# Patient Record
Sex: Female | Born: 1980 | Hispanic: Yes | Marital: Married | Smoking: Former smoker
Health system: Southern US, Community
[De-identification: ages and names within clinical notes are randomized; demographics above are authoritative.]

## PROBLEM LIST (undated history)

## (undated) ENCOUNTER — Inpatient Hospital Stay (HOSPITAL_COMMUNITY): Payer: Self-pay

## (undated) DIAGNOSIS — J3089 Other allergic rhinitis: Secondary | ICD-10-CM

## (undated) DIAGNOSIS — B019 Varicella without complication: Secondary | ICD-10-CM

## (undated) DIAGNOSIS — Z862 Personal history of diseases of the blood and blood-forming organs and certain disorders involving the immune mechanism: Secondary | ICD-10-CM

## (undated) DIAGNOSIS — G43909 Migraine, unspecified, not intractable, without status migrainosus: Secondary | ICD-10-CM

## (undated) DIAGNOSIS — J189 Pneumonia, unspecified organism: Secondary | ICD-10-CM

## (undated) DIAGNOSIS — B009 Herpesviral infection, unspecified: Secondary | ICD-10-CM

## (undated) DIAGNOSIS — Z8669 Personal history of other diseases of the nervous system and sense organs: Secondary | ICD-10-CM

## (undated) DIAGNOSIS — F5002 Anorexia nervosa, binge eating/purging type: Secondary | ICD-10-CM

## (undated) DIAGNOSIS — F50029 Anorexia nervosa, binge eating/purging type, unspecified: Secondary | ICD-10-CM

## (undated) DIAGNOSIS — T7840XA Allergy, unspecified, initial encounter: Secondary | ICD-10-CM

## (undated) DIAGNOSIS — A64 Unspecified sexually transmitted disease: Secondary | ICD-10-CM

## (undated) DIAGNOSIS — Z8616 Personal history of COVID-19: Secondary | ICD-10-CM

## (undated) DIAGNOSIS — O039 Complete or unspecified spontaneous abortion without complication: Secondary | ICD-10-CM

## (undated) DIAGNOSIS — R21 Rash and other nonspecific skin eruption: Secondary | ICD-10-CM

## (undated) DIAGNOSIS — D649 Anemia, unspecified: Secondary | ICD-10-CM

## (undated) HISTORY — PX: DILATION AND CURETTAGE OF UTERUS: SHX78

## (undated) HISTORY — DX: Rash and other nonspecific skin eruption: R21

## (undated) HISTORY — DX: Migraine, unspecified, not intractable, without status migrainosus: G43.909

## (undated) HISTORY — DX: Allergy, unspecified, initial encounter: T78.40XA

## (undated) HISTORY — DX: Anorexia nervosa, binge eating/purging type: F50.02

## (undated) HISTORY — DX: Pneumonia, unspecified organism: J18.9

## (undated) HISTORY — PX: TUMOR REMOVAL: SHX12

## (undated) HISTORY — DX: Varicella without complication: B01.9

## (undated) HISTORY — DX: Anemia, unspecified: D64.9

## (undated) HISTORY — DX: Unspecified sexually transmitted disease: A64

## (undated) HISTORY — DX: Anorexia nervosa, binge eating/purging type, unspecified: F50.029

## (undated) HISTORY — PX: NASAL SEPTUM SURGERY: SHX37

---

## 1998-01-21 ENCOUNTER — Other Ambulatory Visit: Admission: RE | Admit: 1998-01-21 | Discharge: 1998-01-21 | Payer: Self-pay | Admitting: Gynecology

## 1998-10-17 ENCOUNTER — Encounter: Payer: Self-pay | Admitting: Internal Medicine

## 1998-10-17 ENCOUNTER — Emergency Department (HOSPITAL_COMMUNITY): Admission: EM | Admit: 1998-10-17 | Discharge: 1998-10-17 | Payer: Self-pay | Admitting: Internal Medicine

## 1999-03-05 ENCOUNTER — Other Ambulatory Visit: Admission: RE | Admit: 1999-03-05 | Discharge: 1999-03-05 | Payer: Self-pay | Admitting: Gynecology

## 2000-01-12 ENCOUNTER — Other Ambulatory Visit: Admission: RE | Admit: 2000-01-12 | Discharge: 2000-01-12 | Payer: Self-pay | Admitting: Gynecology

## 2013-06-08 ENCOUNTER — Ambulatory Visit (INDEPENDENT_AMBULATORY_CARE_PROVIDER_SITE_OTHER): Payer: PRIVATE HEALTH INSURANCE | Admitting: Emergency Medicine

## 2013-06-08 VITALS — BP 110/70 | HR 53 | Temp 98.3°F | Resp 16 | Ht 65.0 in | Wt 135.0 lb

## 2013-06-08 DIAGNOSIS — L500 Allergic urticaria: Secondary | ICD-10-CM

## 2013-06-08 LAB — POCT CBC
Granulocyte percent: 64.1 %G (ref 37–80)
HCT, POC: 40.6 % (ref 37.7–47.9)
Hemoglobin: 12.7 g/dL (ref 12.2–16.2)
Lymph, poc: 2.1 (ref 0.6–3.4)
MCH, POC: 30.9 pg (ref 27–31.2)
MCHC: 31.3 g/dL — AB (ref 31.8–35.4)
MCV: 98.9 fL — AB (ref 80–97)
MID (cbc): 0.4 (ref 0–0.9)
MPV: 7.8 fL (ref 0–99.8)
POC Granulocyte: 4.6 (ref 2–6.9)
POC LYMPH PERCENT: 30.1 %L (ref 10–50)
POC MID %: 5.8 %M (ref 0–12)
Platelet Count, POC: 315 10*3/uL (ref 142–424)
RBC: 4.11 M/uL (ref 4.04–5.48)
RDW, POC: 12.9 %
WBC: 7.1 10*3/uL (ref 4.6–10.2)

## 2013-06-08 LAB — POCT SEDIMENTATION RATE: POCT SED RATE: 8 mm/hr (ref 0–22)

## 2013-06-08 MED ORDER — METHYLPREDNISOLONE ACETATE 80 MG/ML IJ SUSP
120.0000 mg | Freq: Once | INTRAMUSCULAR | Status: AC
  Administered 2013-06-08: 120 mg via INTRAMUSCULAR

## 2013-06-08 NOTE — Patient Instructions (Signed)
Ronchas   (Hives)   Las ronchas son áreas de la piel inflamadas (hinchadas) rojas y que pican. Pueden cambiar de tamaño y de ubicación en el cuerpo. Las ronchas pueden aparecer y desaparecer durante algunas horas o días (ronchas agudas) o durante algunas semanas (ronchas crónicas). No pueden transmitirse de una persona a otra (no son contagiosas). Pueden empeorar al rascarse, hacer ejercicios y por estrés emocional.   CAUSAS   · Reacción alérgica a alimentos, aditivos o fármacos.  · Infecciones, incluso el resfrío común.  · Enfermedades, como la vasculitis, el lupus o la enfermedad tiroidea.  · Exposición al sol, al calor o al frío.  · La práctica de ejercicios.  · El estrés.  · El contacto con algunas sustancias químicas.  SÍNTOMAS   · Zonas hinchadas, rojas o blancas, sobre la piel. Las ronchas pueden cambiar de tamaño, forma, ubicación y pueden desaparecer repentinamente.  · Picazón.  · Hinchazón de las manos los pies y el rostro. Esto puede ocurrir si las ronchas se desarrollan en capas profundas de la piel.  DIAGNÓSTICO   El médico puede diagnosticar el problema haciendo un examen físico. Le indicará análisis de sangre o un estudio de la piel para determinar la causa. En algunos casos, no puede determinarse la causa.   TRATAMIENTO   Los casos leves generalmente mejoran con medicamentos como los antihistamínicos. Los casos más graves pueden requerir una inyección de epinefrina de emergencia. Si se conoce la causa de la urticaria, el tratamiento incluye evitar el factor desencadenante.   INSTRUCCIONES PARA EL CUIDADO EN EL HOGAR   · Evite las causas que han desencadenado las ronchas.  · Tome los antihistamínicos según las indicaciones del médico para reducir la gravedad de las ronchas. Generalmente se recomiendan los antihistamínicos que no son sedantes o con bajo efecto sedante. No conduzca vehículos mientras toma antihistamínicos.  · Tome los medicamentos para la picazón exactamente como le indicó el  médico.  · Use ropas sueltas.  · Cumpla con todas las visitas de control, según le indique su médico.  SOLICITE ATENCIÓN MÉDICA SI:   · Siente una picazón intensa o persistente que no se calma con los medicamentos.  · Le duelen las articulaciones o están inflamadas.  SOLICITE ATENCIÓN MÉDICA DE INMEDIATO SI:   · Tiene fiebre.  · Tiene la boca o los labios hinchados.  · Tiene problemas para respirar o tragar.  · Siente una opresión en la garganta o en el pecho.  · Siente dolor abdominal.  Estos problemas pueden ser los primeros signos de una reacción alérgica que ponga en peligro la vida. Llame a los servicios de emergencia locales (911 en los [place]).  ASEGÚRESE DE QUE:   · Comprende estas instrucciones.  · Controlará su enfermedad.  · Solicitará ayuda de inmediato si no mejora o si empeora.  Document Released: 04/20/2005 Document Revised: 10/20/2011  ExitCare® Patient Information ©2014 [employer].

## 2013-06-08 NOTE — Progress Notes (Signed)
[facility] [address] [place] [address] [phone]  Date:  06/08/2013   Name:  Linda Herring   DOB:  March 11, 1981   MRN:  [id number]  PCP:  No primary provider on file.    Chief Complaint: Shortness of Breath and Rash   History of Present Illness:  Linda Herring is a 33 y.o. very pleasant female patient who presents with the following:  Last night after work, she noticed a red rash on her cheeks.  She took benadryl and slept all night.  She awoke this morning and had multiple erythematous patches that were pruritic.  She says they are largely gone.  She now has some generalized itching and muscle pains in her back that are pleuritic.  She has no new meds, chemicals or new allergen exposure.  No cough or wheezing, nausea or vomiting.  No insect exposure.  No improvement with over the counter medications or other home remedies. Denies other complaint or health concern today.   There are no active problems to display for this patient.   History reviewed. No pertinent past medical history.  History reviewed. No pertinent past surgical history.  History  Substance Use Topics  . Smoking status: Never Smoker   . Smokeless tobacco: Not on file  . Alcohol Use: No    Family History  Problem Relation Age of Onset  . Cancer Father   . Heart disease Maternal Grandmother   . Cancer Maternal Grandfather   . Cancer Paternal Grandfather   . Stroke Paternal Grandfather     No Known Allergies  Medication list has been reviewed and updated.  No current outpatient prescriptions on file prior to visit.   No current facility-administered medications on file prior to visit.    Review of Systems:  As per HPI, otherwise negative.    Physical Examination: Filed Vitals:   06/08/13 1734  BP: 110/70  Pulse: 53  Temp: 98.3 F (36.8 C)  Resp: 16   Filed Vitals:   06/08/13 1734  Height: 5\' 5"  (1.651 m)  Weight: 135 lb (61.236 kg)   Body mass index is  22.47 kg/(m^2). Ideal Body Weight: Weight in (lb) to have BMI = 25: 149.9  GEN: WDWN, NAD, Non-toxic, A & O x 3 HEENT: Atraumatic, Normocephalic. Neck supple. No masses, No LAD. Ears and Nose: No external deformity. CV: RRR, No M/G/R. No JVD. No thrill. No extra heart sounds. PULM: CTA B, no wheezes, crackles, rhonchi. No retractions. No resp. distress. No accessory muscle use. ABD: S, NT, ND, +BS. No rebound. No HSM. EXTR: No c/c/e NEURO Normal gait.  PSYCH: Normally interactive. Conversant. Not depressed or anxious appearing.  Calm demeanor.  Skin excoriated lesions, minimal hives  Assessment and Plan: Urticaria Benadryl qid Depo medrol  Signed,  Ellison Carwin, MD   Results for orders placed in visit on 06/08/13  POCT CBC      Result Value Range   WBC 7.1  4.6 - 10.2 K/uL   Lymph, poc 2.1  0.6 - 3.4   POC LYMPH PERCENT 30.1  10 - 50 %L   MID (cbc) 0.4  0 - 0.9   POC MID % 5.8  0 - 12 %M   POC Granulocyte 4.6  2 - 6.9   Granulocyte percent 64.1  37 - 80 %G   RBC 4.11  4.04 - 5.48 M/uL   Hemoglobin 12.7  12.2 - 16.2 g/dL   HCT, POC 40.6  37.7 - 47.9 %  MCV 98.9 (*) 80 - 97 fL   MCH, POC 30.9  27 - 31.2 pg   MCHC 31.3 (*) 31.8 - 35.4 g/dL   RDW, POC 12.9     Platelet Count, POC 315  142 - 424 K/uL   MPV 7.8  0 - 99.8 fL

## 2014-03-28 ENCOUNTER — Ambulatory Visit (INDEPENDENT_AMBULATORY_CARE_PROVIDER_SITE_OTHER): Payer: 59 | Admitting: Certified Nurse Midwife

## 2014-03-28 ENCOUNTER — Encounter: Payer: Self-pay | Admitting: Certified Nurse Midwife

## 2014-03-28 VITALS — BP 100/60 | HR 64 | Resp 16 | Ht 65.25 in | Wt 140.0 lb

## 2014-03-28 DIAGNOSIS — Z01419 Encounter for gynecological examination (general) (routine) without abnormal findings: Secondary | ICD-10-CM

## 2014-03-28 DIAGNOSIS — Z124 Encounter for screening for malignant neoplasm of cervix: Secondary | ICD-10-CM

## 2014-03-28 DIAGNOSIS — N390 Urinary tract infection, site not specified: Secondary | ICD-10-CM

## 2014-03-28 DIAGNOSIS — N912 Amenorrhea, unspecified: Secondary | ICD-10-CM

## 2014-03-28 DIAGNOSIS — Z0000 Encounter for general adult medical examination without abnormal findings: Secondary | ICD-10-CM

## 2014-03-28 LAB — LIPID PANEL
CHOL/HDL RATIO: 2.7 ratio
Cholesterol: 181 mg/dL (ref 0–200)
HDL: 68 mg/dL (ref >39)
LDL Cholesterol: 102 mg/dL — ABNORMAL HIGH (ref 0–99)
TRIGLYCERIDES: 54 mg/dL (ref <150)
VLDL: 11 mg/dL (ref 0–40)

## 2014-03-28 LAB — POCT URINALYSIS DIPSTICK
Bilirubin, UA: NEGATIVE
Glucose, UA: NEGATIVE
Ketones, UA: NEGATIVE
Leukocytes, UA: NEGATIVE
Nitrite, UA: POSITIVE
Protein, UA: NEGATIVE
Urobilinogen, UA: NEGATIVE
pH, UA: 5

## 2014-03-28 LAB — POCT URINE PREGNANCY: Preg Test, Ur: NEGATIVE

## 2014-03-28 LAB — CBC
HCT: 40.9 % (ref 36.0–46.0)
Hemoglobin: 13.7 g/dL (ref 12.0–15.0)
MCH: 31.1 pg (ref 26.0–34.0)
MCHC: 33.5 g/dL (ref 30.0–36.0)
MCV: 93 fL (ref 78.0–100.0)
MPV: 8.7 fL — ABNORMAL LOW (ref 9.4–12.4)
Platelets: 374 10*3/uL (ref 150–400)
RBC: 4.4 MIL/uL (ref 3.87–5.11)
RDW: 13.1 % (ref 11.5–15.5)
WBC: 6.2 10*3/uL (ref 4.0–10.5)

## 2014-03-28 LAB — TSH: TSH: 1.609 u[IU]/mL (ref 0.350–4.500)

## 2014-03-28 LAB — HEMOGLOBIN, FINGERSTICK: Hemoglobin, fingerstick: 13.5 g/dL (ref 12.0–16.0)

## 2014-03-28 MED ORDER — NITROFURANTOIN MONOHYD MACRO 100 MG PO CAPS: 100.0000 mg | ORAL_CAPSULE | Freq: Two times a day (BID) | ORAL | Status: DC

## 2014-03-28 NOTE — Patient Instructions (Signed)
Urinary Tract Infection Urinary tract infections (UTIs) can develop anywhere along your urinary tract. Your urinary tract is your body's drainage system for removing wastes and extra water. Your urinary tract includes two kidneys, two ureters, a bladder, and a urethra. Your kidneys are a pair of bean-shaped organs. Each kidney is about the size of your fist. They are located below your ribs, one on each side of your spine. CAUSES Infections are caused by microbes, which are microscopic organisms, including fungi, viruses, and bacteria. These organisms are so small that they can only be seen through a microscope. Bacteria are the microbes that most commonly cause UTIs. SYMPTOMS  Symptoms of UTIs may vary by age and gender of the patient and by the location of the infection. Symptoms in young women typically include a frequent and intense urge to urinate and a painful, burning feeling in the bladder or urethra during urination. Older women and men are more likely to be tired, shaky, and weak and have muscle aches and abdominal pain. A fever may mean the infection is in your kidneys. Other symptoms of a kidney infection include pain in your back or sides below the ribs, nausea, and vomiting. DIAGNOSIS To diagnose a UTI, your caregiver will ask you about your symptoms. Your caregiver also will ask to provide a urine sample. The urine sample will be tested for bacteria and white blood cells. White blood cells are made by your body to help fight infection. TREATMENT  Typically, UTIs can be treated with medication. Because most UTIs are caused by a bacterial infection, they usually can be treated with the use of antibiotics. The choice of antibiotic and length of treatment depend on your symptoms and the type of bacteria causing your infection. HOME CARE INSTRUCTIONS  If you were prescribed antibiotics, take them exactly as your caregiver instructs you. Finish the medication even if you feel better after you  have only taken some of the medication.  Drink enough water and fluids to keep your urine clear or pale yellow.  Avoid caffeine, tea, and carbonated beverages. They tend to irritate your bladder.  Empty your bladder often. Avoid holding urine for long periods of time.  Empty your bladder before and after sexual intercourse.  After a bowel movement, women should cleanse from front to back. Use each tissue only once. SEEK MEDICAL CARE IF:   You have back pain.  You develop a fever.  Your symptoms do not begin to resolve within 3 days. SEEK IMMEDIATE MEDICAL CARE IF:   You have severe back pain or lower abdominal pain.  You develop chills.  You have nausea or vomiting.  You have continued burning or discomfort with urination. MAKE SURE YOU:   Understand these instructions.  Will watch your condition.  Will get help right away if you are not doing well or get worse. Document Released: 01/28/2005 Document Revised: 10/20/2011 Document Reviewed: 05/29/2011 [facility] Patient Information 2015 [place], [place]. This information is not intended to replace advice given to you by your health care provider. Make sure you discuss any questions you have with your health care provider. EXERCISE AND DIET:  We recommended that you start or continue a regular exercise program for good health. Regular exercise means any activity that makes your heart beat faster and makes you sweat.  We recommend exercising at least 30 minutes per day at least 3 days a week, preferably 4 or 5.  We also recommend a diet low in fat and sugar.  Inactivity, poor  dietary choices and obesity can cause diabetes, heart attack, stroke, and kidney damage, among others.    ALCOHOL AND SMOKING:  Women should limit their alcohol intake to no more than 7 drinks/beers/glasses of wine (combined, not each!) per week. Moderation of alcohol intake to this level decreases your risk of breast cancer and liver damage. And of course, no  recreational drugs are part of a healthy lifestyle.  And absolutely no smoking or even second hand smoke. Most people know smoking can cause heart and lung diseases, but did you know it also contributes to weakening of your bones? Aging of your skin?  Yellowing of your teeth and nails?  CALCIUM AND VITAMIN D:  Adequate intake of calcium and Vitamin D are recommended.  The recommendations for exact amounts of these supplements seem to change often, but generally speaking 600 mg of calcium (either carbonate or citrate) and 800 units of Vitamin D per day seems prudent. Certain women may benefit from higher intake of Vitamin D.  If you are among these women, your doctor will have told you during your visit.    PAP SMEARS:  Pap smears, to check for cervical cancer or precancers,  have traditionally been done yearly, although recent scientific advances have shown that most women can have pap smears less often.  However, every woman still should have a physical exam from her gynecologist every year. It will include a breast check, inspection of the vulva and vagina to check for abnormal growths or skin changes, a visual exam of the cervix, and then an exam to evaluate the size and shape of the uterus and ovaries.  And after 35 years of age, a rectal exam is indicated to check for rectal cancers. We will also provide age appropriate advice regarding health maintenance, like when you should have certain vaccines, screening for sexually transmitted diseases, bone density testing, colonoscopy, mammograms, etc.   MAMMOGRAMS:  All women over 58 years old should have a yearly mammogram. Many facilities now offer a "3D" mammogram, which may cost around $50 extra out of pocket. If possible,  we recommend you accept the option to have the 3D mammogram performed.  It both reduces the number of women who will be called back for extra views which then turn out to be normal, and it is better than the routine mammogram at detecting  truly abnormal areas.    COLONOSCOPY:  Colonoscopy to screen for colon cancer is recommended for all women at age 59.  We know, you hate the idea of the prep.  We agree, BUT, having colon cancer and not knowing it is worse!!  Colon cancer so often starts as a polyp that can be seen and removed at colonscopy, which can quite literally save your life!  And if your first colonoscopy is normal and you have no family history of colon cancer, most women don't have to have it again for 10 years.  Once every ten years, you can do something that may end up saving your life, right?  We will be happy to help you get it scheduled when you are ready.  Be sure to check your insurance coverage so you understand how much it will cost.  It may be covered as a preventative service at no cost, but you should check your particular policy.

## 2014-03-28 NOTE — Progress Notes (Signed)
33 y.o. G3P0030 Divorced [employer] here to establish gyn care and for annual exam. Periods normal, minimal cramping. Patient was seen by endocrinologist for infertility and labs and was told she had immune system response that would "attack the pregnancy". She would like to investigate this. She has had 3 SAB with D&C after each. Unknown blood type and Rh. Not trying for pregnancy at this time. Complaining of urinary frequency and urine odor for the past few weeks. Denies back pain or pain with urination, fever or chills. Sees PCP prn. No other health concerns today.   Patient's last menstrual period was 02/24/2014.          Sexually active: Yes.    The current method of family planning is condoms all the time.    Exercising: Yes.    walking Smoker:  no  Health Maintenance: Pap:  02/2013 neg per patient MMG:  none Colonoscopy:  none BMD:   none TDaP:  Within 10 yrs Labs: Poct urine-nitrite pos, rbc-tr, Hgb-13.5, Upt-neg Self breast exam:done occ   reports that she has quit smoking. She does not have any smokeless tobacco history on file. She reports that she drinks about 1.2 - 1.8 oz of alcohol per week. She reports that she does not use illicit drugs.  Past Medical History  Diagnosis Date  . Migraines   . Anemia     Past Surgical History  Procedure Laterality Date  . Dilation and curettage of uterus    . Nasal septum surgery    . 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 ibuprofen (ADVIL,MOTRIN) 200 MG tablet Take 200 mg by mouth every 6 (six) hours as needed.     No current facility-administered medications for this visit.    Family History  Problem Relation Age of Onset  . Cancer Father   . Lung cancer Father   . Diabetes Father   . Heart disease Maternal Grandmother   . Heart attack Maternal Grandmother   . Other Paternal Grandfather     brain tumor  . Colon cancer Paternal Grandmother    . Diabetes Paternal Uncle     ROS:  Pertinent items are noted in HPI.  Otherwise, a comprehensive ROS was negative.  Exam:   BP 100/60 mmHg  Pulse 64  Resp 16  Ht 5' 5.25" (1.657 m)  Wt 140 lb (63.504 kg)  BMI 23.13 kg/m2  LMP 02/24/2014 Height: 5' 5.25" (165.7 cm)  Ht Readings from Last 3 Encounters:  03/28/14 5' 5.25" (1.657 m)  06/08/13 5\' 5"  (1.651 m)    General appearance: alert, cooperative and appears stated age Head: Normocephalic, without obvious abnormality, atraumatic Neck: no adenopathy, supple, symmetrical, trachea midline and thyroid normal to inspection and palpation Lungs: clear to auscultation bilaterally Breasts: normal appearance, no masses or tenderness, No nipple retraction or dimpling, No nipple discharge or bleeding, No axillary or supraclavicular adenopathy Heart: regular rate and rhythm Abdomen: soft, non-tender; no masses,  no organomegaly Extremities: extremities normal, atraumatic, no cyanosis or edema Skin: Skin color, texture, turgor normal. No rashes or lesions Lymph nodes: Cervical, supraclavicular, and axillary nodes normal. No abnormal inguinal nodes palpated Neurologic: Grossly normal   Pelvic: External genitalia:  no lesions              Urethra:  normal appearing urethra with no masses, tenderness or lesions  Bartholin's and Skene's: normal                 Vagina: normal appearing vagina with normal color and discharge, no lesions              Cervix: normal, no lesions, non tender, nabothian cysts seen at os              Pap taken: Yes.   Bimanual Exam:  Uterus:  normal size, contour, position, consistency, mobility, non-tender and mid position tilts to left              Adnexa: normal adnexa and no mass, fullness, tenderness               Rectovaginal: Confirms               Anus:  normal sphincter tone, no lesions  A:  Well Woman with normal exam  Contraception condoms consistent  UTI  OB history of 3 SAB before 12  weeks  Strong family history of heart disease with heart attack early age  P:   Reviewed health and wellness pertinent to exam  Discussed UTI findings and need for treatment patient agreeable. Discussed warning signs of UTI and need to advise. Avoid excessive coffee or tea.  Rx Macrobid see order  Lab: Urine micro/culture  Discussed screening labs and ABO RH to see if this is the concern with pregnancy. Patient agreeable  Labs: Cholesterol, TSH,CBC,   Pap smear taken today with HPVHR   counseled on breast self exam, STD prevention, HIV risk factors and prevention, adequate intake of calcium and vitamin D, diet and exercise. Discussed healthy lifestyle to decrease risk of heart disease.  return annually or prn  An After Visit Summary was printed and given to the patient.

## 2014-03-29 LAB — ABO AND RH: Rh Type: POSITIVE

## 2014-03-29 LAB — URINALYSIS, MICROSCOPIC ONLY
CASTS: NONE SEEN
CRYSTALS: NONE SEEN

## 2014-03-31 LAB — URINE CULTURE

## 2014-03-31 NOTE — Progress Notes (Signed)
Needs referral to Dr. Kerin Perna even if labs are normal.  Reviewed personally.  Felipa Emory, MD.

## 2014-04-02 LAB — IPS PAP TEST WITH HPV

## 2014-04-05 ENCOUNTER — Other Ambulatory Visit: Payer: Self-pay | Admitting: Certified Nurse Midwife

## 2014-04-05 ENCOUNTER — Telehealth: Payer: Self-pay

## 2014-04-05 DIAGNOSIS — N96 Recurrent pregnancy loss: Secondary | ICD-10-CM

## 2014-04-05 DIAGNOSIS — R899 Unspecified abnormal finding in specimens from other organs, systems and tissues: Secondary | ICD-10-CM

## 2014-04-05 NOTE — Telephone Encounter (Signed)
-----   Message from Regina Eck, CNM sent at 04/05/2014  8:46 AM EST ----- Notify blood type B positive TSH normal CBC essentially normal except MPV slightly low but platelets normal range, recheck at [facility] of urine Order in Lipid Panel is optimal level No indications from labs of problems, concerning previous pregnancy history. Patient needs referral to Infertility, Dr Henrietta Dine

## 2014-04-05 NOTE — Telephone Encounter (Signed)
Spoke with patient. Advised patient of message as seen below from Regina Eck CNM. Patient is agreeable and verbalizes understanding. TOC appointment scheduled for 12/16 at 2:30pm. Patient agreeable to date and time. Referral placed to [facility]. Patient aware will be contacted by referral's coordinator in our office or [place] office regarding appointment.   Cc: Felipa Emory for referral  Routing to provider for final review. Patient agreeable to disposition. Will close encounter

## 2014-04-09 ENCOUNTER — Telehealth: Payer: Self-pay | Admitting: Certified Nurse Midwife

## 2014-04-09 NOTE — Telephone Encounter (Signed)
Reviewed and noted.

## 2014-04-09 NOTE — Telephone Encounter (Signed)
Dr.Yalcinkya's office calling with patients appointment date 05/18/2013 @2 :00.

## 2014-04-18 ENCOUNTER — Ambulatory Visit (INDEPENDENT_AMBULATORY_CARE_PROVIDER_SITE_OTHER): Payer: 59

## 2014-04-18 VITALS — BP 102/70 | HR 68 | Ht 65.25 in | Wt 142.0 lb

## 2014-04-18 DIAGNOSIS — R899 Unspecified abnormal finding in specimens from other organs, systems and tissues: Secondary | ICD-10-CM

## 2014-04-18 DIAGNOSIS — N390 Urinary tract infection, site not specified: Secondary | ICD-10-CM

## 2014-04-18 NOTE — Progress Notes (Signed)
Pt came in for a nurse visit to give a urine TOC Pt states no sx and has finished antibiotics.  Culture and Micro sent to lab   CBC was drawn while in office

## 2014-04-19 LAB — CBC
HEMATOCRIT: 40.4 % (ref 36.0–46.0)
HEMOGLOBIN: 13.1 g/dL (ref 12.0–15.0)
MCH: 30 pg (ref 26.0–34.0)
MCHC: 32.4 g/dL (ref 30.0–36.0)
MCV: 92.7 fL (ref 78.0–100.0)
MPV: 8.9 fL — ABNORMAL LOW (ref 9.4–12.4)
Platelets: 344 10*3/uL (ref 150–400)
RBC: 4.36 MIL/uL (ref 3.87–5.11)
RDW: 13 % (ref 11.5–15.5)
WBC: 7.2 10*3/uL (ref 4.0–10.5)

## 2014-04-19 LAB — URINALYSIS, MICROSCOPIC ONLY
CASTS: NONE SEEN
CRYSTALS: NONE SEEN

## 2014-04-20 LAB — URINE CULTURE
Colony Count: NO GROWTH
Organism ID, Bacteria: NO GROWTH

## 2014-09-07 ENCOUNTER — Ambulatory Visit (INDEPENDENT_AMBULATORY_CARE_PROVIDER_SITE_OTHER): Payer: BLUE CROSS/BLUE SHIELD | Admitting: Family

## 2014-09-07 ENCOUNTER — Other Ambulatory Visit (INDEPENDENT_AMBULATORY_CARE_PROVIDER_SITE_OTHER): Payer: BLUE CROSS/BLUE SHIELD

## 2014-09-07 ENCOUNTER — Encounter: Payer: Self-pay | Admitting: Family

## 2014-09-07 VITALS — BP 120/80 | HR 59 | Temp 98.7°F | Resp 18 | Ht 65.0 in | Wt 144.8 lb

## 2014-09-07 DIAGNOSIS — Z889 Allergy status to unspecified drugs, medicaments and biological substances status: Secondary | ICD-10-CM | POA: Insufficient documentation

## 2014-09-07 DIAGNOSIS — R233 Spontaneous ecchymoses: Secondary | ICD-10-CM

## 2014-09-07 DIAGNOSIS — Z9109 Other allergy status, other than to drugs and biological substances: Secondary | ICD-10-CM

## 2014-09-07 DIAGNOSIS — R238 Other skin changes: Secondary | ICD-10-CM

## 2014-09-07 DIAGNOSIS — G43109 Migraine with aura, not intractable, without status migrainosus: Secondary | ICD-10-CM | POA: Insufficient documentation

## 2014-09-07 DIAGNOSIS — G43809 Other migraine, not intractable, without status migrainosus: Secondary | ICD-10-CM

## 2014-09-07 DIAGNOSIS — G43009 Migraine without aura, not intractable, without status migrainosus: Secondary | ICD-10-CM | POA: Insufficient documentation

## 2014-09-07 LAB — CBC WITH DIFFERENTIAL/PLATELET
BASOS PCT: 0.3 % (ref 0.0–3.0)
Basophils Absolute: 0 10*3/uL (ref 0.0–0.1)
EOS PCT: 0.9 % (ref 0.0–5.0)
Eosinophils Absolute: 0.1 10*3/uL (ref 0.0–0.7)
HCT: 39.3 % (ref 36.0–46.0)
Hemoglobin: 13.4 g/dL (ref 12.0–15.0)
LYMPHS ABS: 1.5 10*3/uL (ref 0.7–4.0)
Lymphocytes Relative: 24.6 % (ref 12.0–46.0)
MCHC: 34.2 g/dL (ref 30.0–36.0)
MCV: 91.4 fl (ref 78.0–100.0)
Monocytes Absolute: 0.5 10*3/uL (ref 0.1–1.0)
Monocytes Relative: 7.6 % (ref 3.0–12.0)
NEUTROS PCT: 66.6 % (ref 43.0–77.0)
Neutro Abs: 4 10*3/uL (ref 1.4–7.7)
PLATELETS: 387 10*3/uL (ref 150.0–400.0)
RBC: 4.3 Mil/uL (ref 3.87–5.11)
RDW: 12.9 % (ref 11.5–15.5)
WBC: 5.9 10*3/uL (ref 4.0–10.5)

## 2014-09-07 LAB — IBC PANEL
Iron: 77 ug/dL (ref 42–145)
Saturation Ratios: 20.1 % (ref 20.0–50.0)
TRANSFERRIN: 274 mg/dL (ref 212.0–360.0)

## 2014-09-07 LAB — PROTIME-INR
INR: 1 ratio (ref 0.8–1.0)
PROTHROMBIN TIME: 10.6 s (ref 9.6–13.1)

## 2014-09-07 NOTE — Assessment & Plan Note (Signed)
Indicates she bruises easily. Obtain iron, CBC with differential and INR/PTT.

## 2014-09-07 NOTE — Patient Instructions (Signed)
Thank you for choosing [facility].  Summary/Instructions:  Please stop by the lab on the basement level of the building for your blood work. Your results will be released to [place] (or called to you) after review, usually within 72 hours after test completion. If any changes need to be made, you will be notified at that same time.  Please stop by radiology on the basement level of the building for your x-rays. Your results will be released to  (or called to you) after review, usually within 72 hours after test completion. If any treatments or changes are necessary, you will be notified at that same time.  Referrals have been made during this visit. You should expect to hear back from our schedulers in about 10-14 days in regards to establishing an appointment with the specialists we discussed.   If your symptoms worsen or fail to improve, please contact our office for further instruction, or in case of emergency go directly to the emergency room at the closest medical facility.   Allergy Skin Testing Skin testing is one of the easiest and least expensive tests for detecting allergies. Skin testing is done by injecting an allergen (something that causes an allergic response) into the skin. The size of the wheal (tiny area of swelling) and flare (area of inflammation or redness) surrounding the area of the test, determine if it is positive. There are two methods of doing a skin test. One method is called the prick puncture test where the allergen is injected into the top layer of the skin (epidermis). Another method is injecting the allergen into the slightly deeper layer (dermis). More severe allergic reactions have been reported with this deeper injection. If skin testing has been performed and a questionable result is obtained, it can be confirmed by RAST. Two drawbacks to the RAST test are expense and the results are not being immediately available. PREPARATION FOR TEST No  preparation is necessary but the test should not be done if there have been severe allergic reactions such as anaphylaxis. NORMAL FINDINGS   Less Than 67mm wheal diameter  Less Than 10 mm flare diameter Ranges for normal findings may vary among different laboratories and hospitals. You should always check with your doctor after having lab work or other tests done to discuss the meaning of your test results and whether your values are considered within normal limits. MEANING OF TEST  Your caregiver will go over the test results with you and discuss the importance and meaning of your results, as well as treatment options and the need for additional tests if necessary. OBTAINING THE TEST RESULTS  It is your responsibility to obtain your test results. Ask the lab or department performing the test when and how you will get your results. Document Released: 05/13/2004 Document Revised: 07/13/2011 Document Reviewed: 03/25/2008 [facility] Patient Information 2015 [place], [place]. This information is not intended to replace advice given to you by your health care provider. Make sure you discuss any questions you have with your health care provider.   Migraine Headache A migraine headache is an intense, throbbing pain on one or both sides of your head. A migraine can last for 30 minutes to several hours. CAUSES  The exact cause of a migraine headache is not always known. However, a migraine may be caused when nerves in the brain become irritated and release chemicals that cause inflammation. This causes pain. Certain things may also trigger migraines, such as:  Alcohol.  Smoking.  Stress.  Menstruation.  Aged cheeses.  Foods or drinks that contain nitrates, glutamate, aspartame, or tyramine.  Lack of sleep.  Chocolate.  Caffeine.  Hunger.  Physical exertion.  Fatigue.  Medicines used to treat chest pain (nitroglycerine), birth control pills, estrogen, and some blood pressure  medicines. SIGNS AND SYMPTOMS  Pain on one or both sides of your head.  Pulsating or throbbing pain.  Severe pain that prevents daily activities.  Pain that is aggravated by any physical activity.  Nausea, vomiting, or both.  Dizziness.  Pain with exposure to bright lights, loud noises, or activity.  General sensitivity to bright lights, loud noises, or smells. Before you get a migraine, you may get warning signs that a migraine is coming (aura). An aura may include:  Seeing flashing lights.  Seeing bright spots, halos, or zigzag lines.  Having tunnel vision or blurred vision.  Having feelings of numbness or tingling.  Having trouble talking.  Having muscle weakness. DIAGNOSIS  A migraine headache is often diagnosed based on:  Symptoms.  Physical exam.  A CT scan or MRI of your head. These imaging tests cannot diagnose migraines, but they can help rule out other causes of headaches. TREATMENT Medicines may be given for pain and nausea. Medicines can also be given to help prevent recurrent migraines.  HOME CARE INSTRUCTIONS  Only take over-the-counter or prescription medicines for pain or discomfort as directed by your health care provider. The use of long-term narcotics is not recommended.  Lie down in a dark, quiet room when you have a migraine.  Keep a journal to find out what may trigger your migraine headaches. For example, write down:  What you eat and drink.  How much sleep you get.  Any change to your diet or medicines.  Limit alcohol consumption.  Quit smoking if you smoke.  Get 7-9 hours of sleep, or as recommended by your health care provider.  Limit stress.  Keep lights dim if bright lights bother you and make your migraines worse. SEEK IMMEDIATE MEDICAL CARE IF:   Your migraine becomes severe.  You have a fever.  You have a stiff neck.  You have vision loss.  You have muscular weakness or loss of muscle control.  You start losing  your balance or have trouble walking.  You feel faint or pass out.  You have severe symptoms that are different from your first symptoms. MAKE SURE YOU:   Understand these instructions.  Will watch your condition.  Will get help right away if you are not doing well or get worse. Document Released: 04/20/2005 Document Revised: 09/04/2013 Document Reviewed: 12/26/2012 [facility] Patient Information 2015 [place], [place]. This information is not intended to replace advice given to you by your health care provider. Make sure you discuss any questions you have with your health care provider.

## 2014-09-07 NOTE — Assessment & Plan Note (Signed)
Describes symptoms of atypical migraine. Currently controlled with over-the-counter medications. Requesting referral to neurologist. Referral placed. Continue management with over-the-counter medication as needed. Follow-up if prescription medication and wishes to be tried.

## 2014-09-07 NOTE — Progress Notes (Signed)
Subjective:    Patient ID: Linda Herring, female    DOB: [birth year], 34 y.o.   MRN: [id number]  Chief Complaint  Patient presents with  . Establish Care    x2 months, has started to have bad allergies, says that everything breaks her out, certain foods and even water she says when she takes a bath she breaks out in a rash as soon as she is in water and starts to itch really bad, she also has really bad headaches and wanted to see if she could see a neurologist    HPI:  Linda Herring is a 34 y.o. female who presents today for an office visit to establish care.   1) Allergies - Associated symptoms of allergies has been going on for about 2 months. Notes that there are foods that make her break out including corn dogs, chicken, beef or anything at a restaurant. Reaction is primarily uticaria. May have had some angioedema at some point but is unsure. Notes that this typically does not happen with the same foods she can eat.  Also notes when she took a bath and noted that the water caused her to break out, she has adjusted products and now not currently experiencing symptoms x 4 days. Notes it has happened with crab. Has never been tested for any specific allergies. She currently takes Zyrtec and Benedryl. Has to take the benedryl 4-5 days per week.   2) Headaches -  Previously diagnosed with migraine headaches. Associated symptom of a headaches. Notes that the frequency of specific headaches has increased and is described as needles. Also has headaches that have are described as throbbing with sensitivity to light and sound. Has previous been on medications for headaches which helped. There was one headache that was sensitive to touch and and noted some left sided weakness for a brief period of time which has resolved.    No Known Allergies   Current Outpatient Prescriptions on File Prior to Visit  Medication Sig Dispense Refill  . ibuprofen (ADVIL,MOTRIN) 200 MG tablet Take 200 mg by mouth  every 6 (six) hours as needed.     No current facility-administered medications on file prior to visit.    Past Medical History  Diagnosis Date  . Migraines   . Anemia   . Chicken pox   . Allergy   . Anorexia nervosa with bulimia     Past Surgical History  Procedure Laterality Date  . Dilation and curettage of uterus    . Nasal septum surgery    . 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 Tonsillectomy      Family History  Problem Relation Age of Onset  . Cancer Father   . Lung cancer Father   . Diabetes Father   . Heart disease Maternal Grandmother   . Heart attack Maternal Grandmother   . Other Paternal Grandfather     brain tumor  . Colon cancer Paternal Grandmother     History   Social History  . Marital Status: Divorced    Spouse Name: N/A  . Number of Children: 0  . Years of Education: 16   Occupational History  . Customer Specialist at [employer]    Social History Main Topics  . Smoking status: Former Research scientist (life sciences)  . Smokeless tobacco: Never Used  . Alcohol Use: 1.2 - 1.8 oz/week    2-3 Glasses of wine per week  . Drug Use: No  . Sexual Activity:    Partners:  Male    Birth Control/ Protection: Condom   Other Topics Concern  . Not on file   Social History Narrative   Fun: Read, hike and travel.   Denies religious beliefs effecting health care.     Review of Systems  Constitutional: Negative for fever and chills.  Neurological: Positive for numbness and headaches.      Objective:    BP 120/80 mmHg  Pulse 59  Temp(Src) 98.7 F (37.1 C) (Oral)  Resp 18  Ht 5\' 5"  (1.651 m)  Wt 144 lb 12.8 oz (65.681 kg)  BMI 24.10 kg/m2  SpO2 97% Nursing note and vital signs reviewed.  Physical Exam  Constitutional: She is oriented to person, place, and time. She appears well-developed and well-nourished. No distress.  HENT:  Right Ear: Hearing, tympanic membrane, external ear and ear canal normal.  Left Ear: Hearing, tympanic membrane, external ear and ear canal  normal.  Nose: Nose normal. Right sinus exhibits no maxillary sinus tenderness and no frontal sinus tenderness. Left sinus exhibits no maxillary sinus tenderness and no frontal sinus tenderness.  Mouth/Throat: Uvula is midline, oropharynx is clear and moist and mucous membranes are normal.  Cardiovascular: Normal rate, regular rhythm, normal heart sounds and intact distal pulses.   Pulmonary/Chest: Effort normal and breath sounds normal.  Neurological: She is alert and oriented to person, place, and time.  Skin: Skin is warm and dry.  Psychiatric: She has a normal mood and affect. Her behavior is normal. Judgment and thought content normal.       Assessment & Plan:

## 2014-09-07 NOTE — Assessment & Plan Note (Signed)
Describes multiple allergies to food and environment with no episodes of anaphylaxis. Urticaria appears well controlled with over-the-counter Benadryl. No current outbreaks the last 4 days. Continue to track potential sources of allergies. Refer to allergist for testing. Follow-up if symptoms worsen or fail to improve.

## 2014-09-07 NOTE — Progress Notes (Signed)
Pre visit review using our clinic review tool, if applicable. No additional management support is needed unless otherwise documented below in the visit note. 

## 2014-09-10 ENCOUNTER — Emergency Department (HOSPITAL_COMMUNITY)
Admission: EM | Admit: 2014-09-10 | Discharge: 2014-09-10 | Disposition: A | Discharge status: Home / Self Care | Payer: BLUE CROSS/BLUE SHIELD | Attending: Emergency Medicine | Admitting: Emergency Medicine

## 2014-09-10 ENCOUNTER — Encounter (HOSPITAL_COMMUNITY): Payer: Self-pay | Admitting: Emergency Medicine

## 2014-09-10 DIAGNOSIS — Z79899 Other long term (current) drug therapy: Secondary | ICD-10-CM | POA: Diagnosis not present

## 2014-09-10 DIAGNOSIS — Z8659 Personal history of other mental and behavioral disorders: Secondary | ICD-10-CM | POA: Diagnosis not present

## 2014-09-10 DIAGNOSIS — Z87891 Personal history of nicotine dependence: Secondary | ICD-10-CM | POA: Diagnosis not present

## 2014-09-10 DIAGNOSIS — M5442 Lumbago with sciatica, left side: Secondary | ICD-10-CM | POA: Diagnosis not present

## 2014-09-10 DIAGNOSIS — M545 Low back pain: Secondary | ICD-10-CM | POA: Diagnosis present

## 2014-09-10 DIAGNOSIS — Z8679 Personal history of other diseases of the circulatory system: Secondary | ICD-10-CM | POA: Insufficient documentation

## 2014-09-10 DIAGNOSIS — Z8619 Personal history of other infectious and parasitic diseases: Secondary | ICD-10-CM | POA: Diagnosis not present

## 2014-09-10 DIAGNOSIS — Z862 Personal history of diseases of the blood and blood-forming organs and certain disorders involving the immune mechanism: Secondary | ICD-10-CM | POA: Diagnosis not present

## 2014-09-10 MED ORDER — METHOCARBAMOL 500 MG PO TABS
500.0000 mg | ORAL_TABLET | Freq: Once | ORAL | Status: AC
  Administered 2014-09-10: 500 mg via ORAL
  Filled 2014-09-10: qty 1

## 2014-09-10 MED ORDER — NAPROXEN 500 MG PO TABS: 500.0000 mg | ORAL_TABLET | Freq: Two times a day (BID) | ORAL | Status: DC

## 2014-09-10 MED ORDER — HYDROCODONE-ACETAMINOPHEN 5-325 MG PO TABS: 1.0000 | ORAL_TABLET | Freq: Four times a day (QID) | ORAL | Status: DC | PRN

## 2014-09-10 MED ORDER — METHOCARBAMOL 500 MG PO TABS: 500.0000 mg | ORAL_TABLET | Freq: Two times a day (BID) | ORAL | Status: DC

## 2014-09-10 MED ORDER — OXYCODONE-ACETAMINOPHEN 5-325 MG PO TABS
2.0000 | ORAL_TABLET | Freq: Once | ORAL | Status: AC
  Administered 2014-09-10: 2 via ORAL
  Filled 2014-09-10: qty 2

## 2014-09-10 NOTE — ED Notes (Signed)
Pt c/o low back pain, denies loss of control of bowel or bladder.
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
## 2014-09-10 NOTE — Discharge Instructions (Signed)
Followup with orthopedics if symptoms continue. Use conservative methods at home including heat therapy and cold therapy as we discussed. More information on cold therapy is listed below.  It is not reccommended to use heat treatment directly after an acute injury.  SEEK IMMEDIATE MEDICAL ATTENTION IF: New numbness, tingling, weakness, or problem with the use of your arms or legs.  Severe back pain not relieved with medications.  Change in bowel or bladder control.  Increasing pain in any areas of the body (such as chest or abdominal pain).  Shortness of breath, dizziness or fainting.  Nausea (feeling sick to your stomach), vomiting, fever, or sweats.  COLD THERAPY DIRECTIONS:  Ice or gel packs can be used to reduce both pain and swelling. Ice is the most helpful within the first 24 to 48 hours after an injury or flareup from overusing a muscle or joint.  Ice is effective, has very few side effects, and is safe for most people to use.   If you expose your skin to cold temperatures for too long or without the proper protection, you can damage your skin or nerves. Watch for signs of skin damage due to cold.   HOME CARE INSTRUCTIONS  Follow these tips to use ice and cold packs safely.  Place a dry or damp towel between the ice and skin. A damp towel will cool the skin more quickly, so you may need to shorten the time that the ice is used.  For a more rapid response, add gentle compression to the ice.  Ice for no more than 10 to 20 minutes at a time. The bonier the area you are icing, the less time it will take to get the benefits of ice.  Check your skin after 5 minutes to make sure there are no signs of a poor response to cold or skin damage.  Rest 20 minutes or more in between uses.  Once your skin is numb, you can end your treatment. You can test numbness by very lightly touching your skin. The touch should be so light that you do not see the skin dimple from the pressure of your fingertip.  When using ice, most people will feel these normal sensations in this order: cold, burning, aching, and numbness.  Do not use ice on someone who cannot communicate their responses to pain, such as small children or people with dementia.   HOW TO MAKE AN ICE PACK  To make an ice pack, do one of the following:  Place crushed ice or a bag of frozen vegetables in a sealable plastic bag. Squeeze out the excess air. Place this bag inside another plastic bag. Slide the bag into a pillowcase or place a damp towel between your skin and the bag.  Mix 3 parts water with 1 part rubbing alcohol. Freeze the mixture in a sealable plastic bag. When you remove the mixture from the freezer, it will be slushy. Squeeze out the excess air. Place this bag inside another plastic bag. Slide the bag into a pillowcase or place a damp towel between your skin and the bag.   SEEK MEDICAL CARE IF:  You develop white spots on your skin. This may give the skin a blotchy (mottled) appearance.  Your skin turns blue or pale.  Your skin becomes waxy or hard.  Your swelling gets worse.  MAKE SURE YOU:  Understand these instructions.  Will watch your condition.  Will get help right away if you are not doing well or   get worse.

## 2014-10-24 ENCOUNTER — Encounter: Payer: Self-pay | Admitting: Neurology

## 2014-10-24 ENCOUNTER — Ambulatory Visit (INDEPENDENT_AMBULATORY_CARE_PROVIDER_SITE_OTHER): Payer: BLUE CROSS/BLUE SHIELD | Admitting: Neurology

## 2014-10-24 VITALS — BP 118/78 | HR 66 | Resp 20 | Ht 65.0 in | Wt 144.4 lb

## 2014-10-24 DIAGNOSIS — G43009 Migraine without aura, not intractable, without status migrainosus: Secondary | ICD-10-CM

## 2014-10-24 DIAGNOSIS — M5481 Occipital neuralgia: Secondary | ICD-10-CM | POA: Diagnosis not present

## 2014-10-24 NOTE — Progress Notes (Signed)
See note
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
## 2014-10-24 NOTE — Patient Instructions (Signed)
I think you have occipital neuralgia.  However, if it is so infrequent, I wouldn't do anything for it.  If it gets worse, call us and we can re-evaluate.

## 2014-12-06 ENCOUNTER — Institutional Professional Consult (permissible substitution): Payer: BLUE CROSS/BLUE SHIELD | Admitting: Internal Medicine

## 2015-04-16 ENCOUNTER — Ambulatory Visit (INDEPENDENT_AMBULATORY_CARE_PROVIDER_SITE_OTHER): Payer: BLUE CROSS/BLUE SHIELD | Admitting: Certified Nurse Midwife

## 2015-04-16 ENCOUNTER — Encounter: Payer: Self-pay | Admitting: Certified Nurse Midwife

## 2015-04-16 VITALS — BP 110/70 | HR 68 | Resp 16 | Ht 65.5 in | Wt 149.0 lb

## 2015-04-16 DIAGNOSIS — N9489 Other specified conditions associated with female genital organs and menstrual cycle: Secondary | ICD-10-CM

## 2015-04-16 DIAGNOSIS — Z01419 Encounter for gynecological examination (general) (routine) without abnormal findings: Secondary | ICD-10-CM

## 2015-04-16 DIAGNOSIS — N898 Other specified noninflammatory disorders of vagina: Secondary | ICD-10-CM

## 2015-04-16 NOTE — Patient Instructions (Signed)

## 2015-04-16 NOTE — Progress Notes (Signed)
34 y.o. G9P0030 Divorced  Hispanic Fe here for annual exam. Periods normal, no issues. Contraception working well.  Currently under treatment for slipped disc, seeing Orthopedic. Now engaged with wedding 08/31/15! Sees Urgent Care if needed. Has noted vaginal odor for the past few weeks, denies vaginal burning or itching. No new personal products or STD concerns. Please check. No other health issues today. Declines any labs, had done in 4/16, normal per patient.  Patient's last menstrual period was 04/05/2015 (exact date).          Sexually active: Yes.    The current method of family planning is condoms     Exercising: No.  Patient hurt her back during excercise. Smoker:  no  Health Maintenance: Pap:  03/28/14 Negative neg HRHPV MMG:  None Colonoscopy:  None BMD:   None TDaP:  Within the past 10 years as per pt Labs: PCP   reports that she has quit smoking. She has never used smokeless tobacco. She reports that she drinks about 1.2 - 1.8 oz of alcohol per week. She reports that she does not use illicit drugs.  Past Medical History  Diagnosis Date  . Migraines   . Anemia   . Chicken pox   . Allergy   . Anorexia nervosa with bulimia   . Rash     Stress induced    Past Surgical History  Procedure Laterality Date  . Dilation and curettage of uterus    . Nasal septum surgery    . 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 Tonsillectomy      Current Outpatient Prescriptions  Medication Sig Dispense Refill  . naproxen (NAPROSYN) 500 MG tablet Take 1 tablet (500 mg total) by mouth 2 (two) times daily. 30 tablet 0   No current facility-administered medications for this visit.    Family History  Problem Relation Age of Onset  . Cancer Father   . Lung cancer Father   . Diabetes Father   . Heart disease Maternal Grandmother   . Heart attack Maternal Grandmother   . Other Paternal Grandfather     brain tumor  . Colon cancer Paternal Grandmother   . Cancer Sister     uterine     ROS:   Pertinent items are noted in HPI.  Otherwise, a comprehensive ROS was negative.  Exam:   BP 110/70 mmHg  Pulse 68  Resp 16  Ht 5' 5.5" (1.664 m)  Wt 149 lb (67.586 kg)  BMI 24.41 kg/m2  LMP 04/05/2015 (Exact Date) Height: 5' 5.5" (166.4 cm) Ht Readings from Last 3 Encounters:  04/16/15 5' 5.5" (1.664 m)  10/24/14 5\' 5"  (1.651 m)  09/07/14 5\' 5"  (1.651 m)    General appearance: alert, cooperative and appears stated age Head: Normocephalic, without obvious abnormality, atraumatic Neck: no adenopathy, supple, symmetrical, trachea midline and thyroid normal to inspection and palpation Lungs: clear to auscultation bilaterally Breasts: normal appearance, no masses or tenderness, No nipple retraction or dimpling, No nipple discharge or bleeding, No axillary or supraclavicular adenopathy Heart: regular rate and rhythm Abdomen: soft, non-tender; no masses,  no organomegaly Extremities: extremities normal, atraumatic, no cyanosis or edema Skin: Skin color, texture, turgor normal. No rashes or lesions Lymph nodes: Cervical, supraclavicular, and axillary nodes normal. No abnormal inguinal nodes palpated Neurologic: Grossly normal   Pelvic: External genitalia:  no lesions              Urethra:  normal appearing urethra with no masses,  tenderness or lesions              Bartholin's and Skene's: normal                 Vagina: normal appearing vagina with normal color and discharge, no lesions, slight sour odor noted              Cervix: normal,non tender, no lesions              Pap taken: No. Bimanual Exam:  Uterus:  normal size, contour, position, consistency, mobility, non-tender              Adnexa: normal adnexa and no mass, fullness, tenderness               Rectovaginal: Confirms               Anus:  normal sphincter tone, no lesions  Chaperone present: yes  A:  Well Woman with normal exam  Contraception Condoms  Vaginal odor R/O vaginal infection  Slipped disc in back under  follow up and treatment  P:   Reviewed health and wellness pertinent to exam  Discussed baking soda tub bath to help with odor with instructions. Affirm taken will treat if indicated  Continue follow up as indicated  Pap smear as above not taken   counseled on breast self exam, adequate intake of calcium and vitamin D, diet and exercise  return annually or prn  An After Visit Summary was printed and given to the patient.

## 2015-04-17 LAB — WET PREP BY MOLECULAR PROBE
Candida species: NEGATIVE
Gardnerella vaginalis: NEGATIVE
Trichomonas vaginosis: NEGATIVE

## 2015-04-17 NOTE — Progress Notes (Signed)
Reviewed personally.  M. Suzanne Jasmane Brockway, MD.  

## 2015-09-16 ENCOUNTER — Encounter: Payer: Self-pay | Admitting: Nurse Practitioner

## 2015-09-16 ENCOUNTER — Telehealth: Payer: Self-pay | Admitting: Certified Nurse Midwife

## 2015-09-16 ENCOUNTER — Ambulatory Visit (INDEPENDENT_AMBULATORY_CARE_PROVIDER_SITE_OTHER): Payer: Commercial Managed Care - HMO | Admitting: Nurse Practitioner

## 2015-09-16 VITALS — BP 122/80 | HR 64 | Temp 98.6°F | Ht 65.5 in | Wt 149.0 lb

## 2015-09-16 DIAGNOSIS — N760 Acute vaginitis: Secondary | ICD-10-CM | POA: Diagnosis not present

## 2015-09-16 DIAGNOSIS — L720 Epidermal cyst: Secondary | ICD-10-CM

## 2015-09-16 MED ORDER — NONFORMULARY OR COMPOUNDED ITEM: Status: DC

## 2015-09-16 NOTE — Telephone Encounter (Signed)
Patient has a red itchy spot between her vagina and bottom area.

## 2015-09-16 NOTE — Patient Instructions (Signed)
We will call with test results.

## 2015-09-16 NOTE — Telephone Encounter (Signed)
Spoke with patient. Patient states that last Wednesday 5/10 she began to have slight itching between her vagina and rectum. Over the weekend she did a lot of walking and itching increased. Yesterday she noticed a "red spot" between her vagina and rectum. Advised patient she will need to be seen for further evaluation in the office. She is agreeable. Aware Melvia Heaps CNM is out of the office today. She is agreeable to see another provider. Appointment scheduled for today 09/16/2015 at 10:15 am with Kem Boroughs, FNP. She is agreeable to date and time.  Routing to provider for final review. Patient agreeable to disposition. Will close encounter.

## 2015-09-16 NOTE — Progress Notes (Signed)
35 y.o. Divorced Hispanic female G64P0030 here with complaint of pain and swelling of perianal area since last Wednesday.  She was in the car when she noted a discomfort when she at down on a hot seat last week.  She then thinks she was itching and made the sore place raw.  She has multiple inclusion cyst all around the perianal area; several of these also look irritated from scratching.  Denies new personal products or vaginal dryness. Last SA 3-4 weeks ago.  LMP 09/11/15.  No STD concerns. Same partner for 3 yrs.    Urinary symptoms none . Contraception is condoms always. No known exposure to HSV.   O:  Healthy female WDWN Affect: normal, orientation x 3  Exam: no distress Abdomen: soft and non tender Lymph node: no enlargement or tenderness Pelvic exam: External genital: normal female BUS: negative Vagina: no discharge noted.   Peri anal area:  Multiple small inclusion cyst -some of them expressed with white discharge.  She also has a superficial lesion on the right side that could be scratch marks but not a typical lesion for HSV. Culture is taken to make sure.  HSV culture is done   A: Perianal lesion  Doubt HSV - will follow with culture  multiple small inclusion cyst   P: Discussed findings of irritation and use warm compresses prn.  Do not scratch this area.   Rx: Triamcinolone with Silvadene to use 2-3 times a day for comfort  Follow with culture  If symptoms worsen to CB  RV prn

## 2015-09-18 LAB — HERPES SIMPLEX VIRUS CULTURE: Organism ID, Bacteria: DETECTED

## 2015-09-20 ENCOUNTER — Telehealth: Payer: Self-pay | Admitting: Nurse Practitioner

## 2015-09-20 ENCOUNTER — Encounter: Payer: Self-pay | Admitting: Nurse Practitioner

## 2015-09-20 ENCOUNTER — Ambulatory Visit (INDEPENDENT_AMBULATORY_CARE_PROVIDER_SITE_OTHER): Payer: Commercial Managed Care - HMO | Admitting: Nurse Practitioner

## 2015-09-20 VITALS — BP 110/74 | HR 68 | Ht 65.5 in | Wt 149.0 lb

## 2015-09-20 DIAGNOSIS — A609 Anogenital herpesviral infection, unspecified: Secondary | ICD-10-CM | POA: Diagnosis not present

## 2015-09-20 MED ORDER — VALACYCLOVIR HCL 500 MG PO TABS: 500.0000 mg | ORAL_TABLET | Freq: Two times a day (BID) | ORAL | Status: DC

## 2015-09-20 NOTE — Progress Notes (Signed)
35 y.o. Divorced Hispanic female G3P0030 here for follow up of vaginal lesion.  She was initially treated with Triamcinolone and Silvadene for what appeared to be a burn or irritation. It was atypical for HSV.   Since then, her HSV culture is back and was diagnosed with HSV - but not enough serum to identify the type.  At phone call on Wednesday she was better and did not feel that she needed to start antivirals.   Then later noted 2 other lesions very close to the same location.  Decided she wanted to be seen and discuss.  Denies any symptoms of UTI.  She has discussed some with her boyfriend about infection but was waiting to get results.     O: Healthy WD,WN female Affect:  anxious Abdomen:soft, non tender Pelvic exam:EXTERNAL GENITALIA: normal appearing vulva with no masses, tenderness or lesions, but there remains the lesion peri rectal on the right side with a smaller lesion than noted on Monday.  2 other small drying lesions beside.  No discharge and nothing to get a culture from.  The original lesion is still very superficial and drying.      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  Then maintenance daily.  If another flare to call back for a repeat culture.  This lesion today, there is no serum to re collect.  Labs : serum HSV IGG/ IGM  Instructions given regarding: Viral transmission and treatment  RV

## 2015-09-20 NOTE — Patient Instructions (Signed)
Will call with test results.  If negative - she may want a repeat of HSV testing.

## 2015-09-20 NOTE — Telephone Encounter (Signed)
Patient says she is returning a call to Linda Herring, [place]. No open phone notes.

## 2015-09-20 NOTE — Telephone Encounter (Signed)
Spoke with patient. Patient states that yesterday she noticed two new lesions. Patient is very concerned. "I do not feel like it is getting better." Advised patient that she may return to the office today for evaluation and a repeat Herpes culture of new lesions. She is agreeable. Appointment scheduled for today at 11 am with Kem Boroughs, FNP. She is agreeable to date and time.  Notes Recorded by Michele Mcalpine, RN on 09/19/2015 at 9:40 AM Patient aware of results per message from Kem Boroughs, [place].   Notes Recorded by Kem Boroughs, FNP on 09/18/2015 at 5:28 PM Patient is called about HSV results that it does show HSV There is not enough antigen to determine the type of HSV. She is feeling so much better and vulvar area is healing with only 2 days of treatment with Silvadene and Triamcinolone. She will wait until Friday and CB if not better she may want to treat with antivirals. She is aware that if a similar lesion were to come back to call us and we can repeat the Herpes culture while the lesion is still there.  Routing to provider for final review. Patient agreeable to disposition. Will close encounter.

## 2015-09-22 NOTE — Progress Notes (Signed)
Encounter reviewed by Dr. Brook Amundson C. Silva.  

## 2015-09-23 LAB — HSV(HERPES SMPLX)ABS-I+II(IGG+IGM)-BLD
HSV 1 GLYCOPROTEIN G AB, IGG: 9.48 {index} — AB (ref <0.90)
HSV 2 Glycoprotein G Ab, IgG: <0.9 Index (ref <0.90)
Herpes Simplex Vrs I&II-IgM Ab (EIA): 2.35 INDEX — ABNORMAL HIGH

## 2015-09-24 ENCOUNTER — Telehealth: Payer: Self-pay | Admitting: Nurse Practitioner

## 2015-09-24 NOTE — Telephone Encounter (Signed)
See results note about HSV test results.

## 2015-09-24 NOTE — Telephone Encounter (Signed)
See result note for labs.

## 2015-12-12 ENCOUNTER — Ambulatory Visit (INDEPENDENT_AMBULATORY_CARE_PROVIDER_SITE_OTHER): Payer: Commercial Managed Care - HMO | Admitting: Family Medicine

## 2015-12-12 ENCOUNTER — Telehealth: Payer: Self-pay | Admitting: Internal Medicine

## 2015-12-12 VITALS — BP 110/60 | HR 78 | Temp 98.3°F | Resp 16 | Ht 65.75 in | Wt 144.8 lb

## 2015-12-12 DIAGNOSIS — M5441 Lumbago with sciatica, right side: Secondary | ICD-10-CM

## 2015-12-12 MED ORDER — TRAMADOL HCL 50 MG PO TABS: 50.0000 mg | ORAL_TABLET | Freq: Three times a day (TID) | ORAL | 1 refills | Status: DC | PRN

## 2015-12-12 MED ORDER — METHOCARBAMOL 500 MG PO TABS: 500.0000 mg | ORAL_TABLET | Freq: Four times a day (QID) | ORAL | 1 refills | Status: DC

## 2015-12-12 MED ORDER — KETOROLAC TROMETHAMINE 60 MG/2ML IM SOLN
60.0000 mg | Freq: Once | INTRAMUSCULAR | Status: AC
  Administered 2015-12-12: 60 mg via INTRAMUSCULAR

## 2015-12-12 NOTE — Telephone Encounter (Signed)
Patient Name: Linda Herring  DOB: 07-20-1980    Initial Comment Caller states his wife is having pain in her right [place]. Lower back. Weakness in right leg.   Nurse Assessment  Nurse: Raphael Gibney, RN, Vanita Ingles Date/Time (Eastern Time): 12/12/2015 10:02:25 AM  Confirm and document reason for call. If symptomatic, describe symptoms. You must click the next button to save text entered. ---Caller states she has a lot of pain in her right lower back. history of sciatica. She is having weakness in her right leg. She can walk but having a lot of pain when she walks. Pain level 7-8 with ambulation  Has the patient traveled out of the country within the last 30 days? ---Not Applicable  Does the patient have any new or worsening symptoms? ---Yes  Will a triage be completed? ---Yes  Related visit to physician within the last 2 weeks? ---No  Does the PT have any chronic conditions? (i.e. diabetes, asthma, etc.) ---Yes  List chronic conditions. ---slipped disc  Is the patient pregnant or possibly pregnant? (Ask all females between the ages of 52-55) ---No  Is this a behavioral health or substance abuse call? ---No     Guidelines    Guideline Title Affirmed Question Affirmed Notes  Back Pain [1] SEVERE back pain (e.g., excruciating, unable to do any normal activities) AND [2] not improved 2 hours after pain medicine    Final Disposition User   See Physician within 4 Hours (or PCP triage) Raphael Gibney, RN, [place]    Comments  no appts available at [place] is going to urgent care. She would like call back from office to schedule appt tomorrow.   Referrals  [facility]   Disagree/Comply: Comply
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
## 2015-12-12 NOTE — Patient Instructions (Addendum)
You have a bad muscle spasm, as you know.  Take the Tramadol every 6 - 8 hours as needed, 1-2 pills for pain relief.  You can alternate this with the Ibuprofen as an anti-inflammatory as well, up to 600 mg every 8 hours.    Take the Robaxin as the muscle relaxer.  Don't drive with this if it makes you sleepy.  Best of luck with the wedding!    IF you received an x-ray today, you will receive an invoice from [facility]. Please contact [facility] at [phone] with questions or concerns regarding your invoice.   IF you received labwork today, you will receive an invoice from [employer]. Please contact [facility] at [phone] with questions or concerns regarding your invoice.   Our billing staff will not be able to assist you with questions regarding bills from these companies.  You will be contacted with the lab results as soon as they are available. The fastest way to get your results is to activate your My Chart account. Instructions are located on the last page of this paperwork. If you have not heard from [place] regarding the results in 2 weeks, please contact this office.

## 2016-03-23 ENCOUNTER — Telehealth: Payer: Self-pay | Admitting: Certified Nurse Midwife

## 2016-03-23 NOTE — Telephone Encounter (Signed)
Left message to call Sharee Pimple at [phone].   Spoke with Thayer Headings at [facility] -no prescription for Valtrex on file.  Kem Boroughs, NP -ok to refill?

## 2016-03-23 NOTE — Telephone Encounter (Signed)
Patient says she has the same problem as last time and is having a break out.  Patient wants to know if you will call in a refill of Valacyclovir 500 mg.

## 2016-03-23 NOTE — Telephone Encounter (Signed)
Spoke with patient. 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 Denies any urinary complaints or vaginal discharge. Advised will review with Kem Boroughs, NP and return call with recommendations. Verified pharmacy on file.  Kem Boroughs, NP -ok to refill valtrex?

## 2016-03-24 MED ORDER — VALACYCLOVIR HCL 500 MG PO TABS: 500.0000 mg | ORAL_TABLET | Freq: Two times a day (BID) | ORAL | 0 refills | Status: DC

## 2016-03-24 NOTE — Telephone Encounter (Signed)
Rx for Valtrex 500 mg BID #30 0RF sent to pharmacy on file. Patient has been notified and is agreeable.  Routing to provider for final review. Patient agreeable to disposition. Will close encounter.

## 2016-03-24 NOTE — Telephone Encounter (Signed)
Yes OK to refill Valtrex 500 mg BID # 30 to take as directed prn for flare.

## 2016-03-24 NOTE — Telephone Encounter (Signed)
Patient returned call

## 2016-03-25 ENCOUNTER — Telehealth: Payer: Self-pay | Admitting: Certified Nurse Midwife

## 2016-03-25 NOTE — Telephone Encounter (Signed)
Left message regarding upcoming appointment has been canceled and needs to be rescheduled. °

## 2016-04-16 ENCOUNTER — Ambulatory Visit: Payer: BLUE CROSS/BLUE SHIELD | Admitting: Certified Nurse Midwife

## 2016-04-20 ENCOUNTER — Other Ambulatory Visit: Payer: Self-pay | Admitting: Nurse Practitioner

## 2016-04-20 NOTE — Telephone Encounter (Signed)
Medication refill request: Vitamin D  Last AEX:  04-16-15  Next AEX: 04-22-16  Last MMG (if hormonal medication request): N/A Refill authorized: please advise

## 2016-04-21 NOTE — Telephone Encounter (Signed)
Will hold until exam

## 2016-04-22 ENCOUNTER — Encounter: Payer: Self-pay | Admitting: Certified Nurse Midwife

## 2016-04-22 ENCOUNTER — Ambulatory Visit (INDEPENDENT_AMBULATORY_CARE_PROVIDER_SITE_OTHER): Payer: Commercial Managed Care - HMO | Admitting: Certified Nurse Midwife

## 2016-04-22 VITALS — BP 102/60 | HR 68 | Resp 16 | Ht 65.0 in | Wt 151.0 lb

## 2016-04-22 DIAGNOSIS — Z3002 Counseling and instruction in natural family planning to avoid pregnancy: Secondary | ICD-10-CM | POA: Diagnosis not present

## 2016-04-22 DIAGNOSIS — Z01419 Encounter for gynecological examination (general) (routine) without abnormal findings: Secondary | ICD-10-CM | POA: Diagnosis not present

## 2016-04-22 DIAGNOSIS — IMO0002 Reserved for concepts with insufficient information to code with codable children: Secondary | ICD-10-CM

## 2016-04-22 DIAGNOSIS — Z124 Encounter for screening for malignant neoplasm of cervix: Secondary | ICD-10-CM

## 2016-04-22 DIAGNOSIS — Z3169 Encounter for other general counseling and advice on procreation: Secondary | ICD-10-CM

## 2016-04-22 DIAGNOSIS — Z0000 Encounter for general adult medical examination without abnormal findings: Secondary | ICD-10-CM

## 2016-04-22 LAB — POCT URINALYSIS DIPSTICK
BILIRUBIN UA: NEGATIVE
Blood, UA: NEGATIVE
GLUCOSE UA: NEGATIVE
Ketones, UA: NEGATIVE
Leukocytes, UA: NEGATIVE
NITRITE UA: NEGATIVE
Protein, UA: NEGATIVE
Urobilinogen, UA: NEGATIVE
pH, UA: 5

## 2016-04-22 LAB — CBC
HEMATOCRIT: 41.4 % (ref 35.0–45.0)
HEMOGLOBIN: 13.5 g/dL (ref 11.7–15.5)
MCH: 30.9 pg (ref 27.0–33.0)
MCHC: 32.6 g/dL (ref 32.0–36.0)
MCV: 94.7 fL (ref 80.0–100.0)
MPV: 8.6 fL (ref 7.5–12.5)
Platelets: 354 10*3/uL (ref 140–400)
RBC: 4.37 MIL/uL (ref 3.80–5.10)
RDW: 12.7 % (ref 11.0–15.0)
WBC: 6.5 10*3/uL (ref 3.8–10.8)

## 2016-04-22 LAB — TSH: TSH: 1.39 mIU/L

## 2016-04-22 NOTE — Patient Instructions (Signed)

## 2016-04-22 NOTE — Progress Notes (Signed)
35 y.o. [id number] Divorced  Hispanic Linda Herring here for annual exam.Periods normal ,no issues. Recently married 5 months ago, now moving into new house and plans to try for pregnancy in next year. Contraception condoms consistently. Spouse non smoker, no family history of birth defects or health issues.  Patient sees Urgent care if needed. No other health issues today.     Patient's last menstrual period was 04/05/2016.          Sexually active: Yes.    The current method of family planning is condoms all the time.    Exercising: Yes.    routine at the gym Smoker:  no  Health Maintenance: Pap:  03-28-14 neg HPV HR neg MMG:  none Colonoscopy:  none BMD:   none TDaP:  Within pt 80yrs, pt to check her records Shingles: not done Pneumonia: not done Hep C and HIV: HIV neg 43yrs ago Labs: poct urine-neg Self breast exam: done occ   reports that she has quit smoking. She has never used smokeless tobacco. She reports that she drinks about 1.2 oz of alcohol per week . She reports that she does not use drugs.  Past Medical History:  Diagnosis Date  . Allergy   . Anemia   . Anorexia nervosa with bulimia   . Chicken pox   . Migraines   . Rash    Stress induced  . STD (sexually transmitted disease)    HSV1    Past Surgical History:  Procedure Laterality Date  . DILATION AND CURETTAGE OF UTERUS    . NASAL SEPTUM SURGERY    . TUMOR REMOVAL     from face    Current Outpatient Prescriptions  Medication Sig Dispense Refill  . meloxicam (MOBIC) 15 MG tablet Take 15 mg by mouth daily as needed for pain.    . methocarbamol (ROBAXIN) 500 MG tablet Take 1 tablet (500 mg total) by mouth 4 (four) times daily. 45 tablet 1  . traMADol (ULTRAM) 50 MG tablet Take 1-2 tablets (50-100 mg total) by mouth every 8 (eight) hours as needed. 30 tablet 1  . valACYclovir (VALTREX) 500 MG tablet Take 1 tablet (500 mg total) by mouth 2 (two) times daily. As directed for flare. 30 tablet 0   No current  facility-administered medications for this visit.     Family History  Problem Relation Age of Onset  . Lung cancer Father   . Diabetes Father   . Heart disease Maternal Grandmother   . Heart attack Maternal Grandmother   . Other Paternal Grandfather     brain tumor  . Colon cancer Paternal Grandmother   . Cancer Sister     uterine     ROS:  Pertinent items are noted in HPI.  Otherwise, a comprehensive ROS was negative.  Exam:   BP 102/60   Pulse 68   Resp 16   Ht 5\' 5"  (1.651 m)   Wt 151 lb (68.5 kg)   LMP 04/05/2016   BMI 25.13 kg/m  Height: 5\' 5"  (165.1 cm) Ht Readings from Last 3 Encounters:  04/22/16 5\' 5"  (1.651 m)  12/12/15 5' 5.75" (1.67 m)  09/20/15 5' 5.5" (1.664 m)    General appearance: alert, cooperative and appears stated age Head: Normocephalic, without obvious abnormality, atraumatic Neck: no adenopathy, supple, symmetrical, trachea midline and thyroid normal to inspection and palpation Lungs: clear to auscultation bilaterally Breasts: normal appearance, no masses or tenderness, No nipple retraction or dimpling, No nipple discharge or bleeding, No axillary  or supraclavicular adenopathy Heart: regular rate and rhythm Abdomen: soft, non-tender; no masses,  no organomegaly Extremities: extremities normal, atraumatic, no cyanosis or edema Skin: Skin color, texture, turgor normal. No rashes or lesions Lymph nodes: Cervical, supraclavicular, and axillary nodes normal. No abnormal inguinal nodes palpated Neurologic: Grossly normal   Pelvic: External genitalia:  no lesions              Urethra:  normal appearing urethra with no masses, tenderness or lesions              Bartholin's and Skene's: normal                 Vagina: normal appearing vagina with normal color and discharge, no lesions              Cervix: no bleeding following Pap, no cervical motion tenderness and no lesions              Pap taken: Yes.   Bimanual Exam:  Uterus:  normal size,  contour, position, consistency, mobility, non-tender              Adnexa: normal adnexa and no mass, fullness, tenderness               Rectovaginal: Confirms               Anus:  normal sphincter tone, no lesions  Chaperone present: yes  A:  Well Woman with normal exam  Contraception condoms consistently  Screening labs  Preconceptual information  P:   Reviewed health and wellness pertinent to exam  Lab: Rubella, TSH,CBC  Discussed ovulation chart, and shown how to use for trying for pregnancy. Discussed avoidance of alcohol, drugs and eating well for attempting pregnancy. No environmental concerns. Patient will call if no pregnancy in 6 months due to [facility]. Patient Blood type B+. Start on one a day OTC prenatal vitamin daily now.Questions addressed at length.  Pap smear as above with HPV reflex   counseled on breast self exam, adequate intake of calcium and vitamin D, diet and exercise  return annually or prn   15 minutes in addition to exam spent in preconceptual consultation  An After Visit Summary was printed and given to the patient.

## 2016-04-23 LAB — IPS PAP TEST WITH REFLEX TO HPV

## 2016-04-23 LAB — RUBELLA SCREEN: Rubella: <0.9 Index (ref <0.90)

## 2016-04-24 NOTE — Progress Notes (Signed)
Encounter reviewed Jill Jertson, MD   

## 2016-05-19 ENCOUNTER — Encounter: Payer: Self-pay | Admitting: Certified Nurse Midwife

## 2016-05-25 ENCOUNTER — Other Ambulatory Visit (INDEPENDENT_AMBULATORY_CARE_PROVIDER_SITE_OTHER): Payer: Commercial Managed Care - HMO

## 2016-05-25 ENCOUNTER — Ambulatory Visit (INDEPENDENT_AMBULATORY_CARE_PROVIDER_SITE_OTHER): Payer: Commercial Managed Care - HMO | Admitting: Family

## 2016-05-25 ENCOUNTER — Encounter: Payer: Self-pay | Admitting: Family

## 2016-05-25 VITALS — BP 102/72 | HR 67 | Temp 98.6°F | Resp 16 | Ht 65.0 in | Wt 148.0 lb

## 2016-05-25 DIAGNOSIS — Z0000 Encounter for general adult medical examination without abnormal findings: Secondary | ICD-10-CM | POA: Diagnosis not present

## 2016-05-25 DIAGNOSIS — Z23 Encounter for immunization: Secondary | ICD-10-CM | POA: Diagnosis not present

## 2016-05-25 LAB — COMPREHENSIVE METABOLIC PANEL
ALBUMIN: 4.6 g/dL (ref 3.5–5.2)
ALT: 11 U/L (ref 0–35)
AST: 15 U/L (ref 0–37)
Alkaline Phosphatase: 50 U/L (ref 39–117)
BILIRUBIN TOTAL: 0.5 mg/dL (ref 0.2–1.2)
BUN: 8 mg/dL (ref 6–23)
CALCIUM: 9.3 mg/dL (ref 8.4–10.5)
CO2: 29 mEq/L (ref 19–32)
CREATININE: 0.71 mg/dL (ref 0.40–1.20)
Chloride: 102 mEq/L (ref 96–112)
GFR: 98.42 mL/min (ref >60.00)
Glucose, Bld: 102 mg/dL — ABNORMAL HIGH (ref 70–99)
Potassium: 3.9 mEq/L (ref 3.5–5.1)
SODIUM: 138 meq/L (ref 135–145)
Total Protein: 7.8 g/dL (ref 6.0–8.3)

## 2016-05-25 LAB — LIPID PANEL
CHOLESTEROL: 214 mg/dL — AB (ref 0–200)
HDL: 74.3 mg/dL (ref >39.00)
LDL CALC: 117 mg/dL — AB (ref 0–99)
NonHDL: 140.09
TRIGLYCERIDES: 113 mg/dL (ref 0.0–149.0)
Total CHOL/HDL Ratio: 3
VLDL: 22.6 mg/dL (ref 0.0–40.0)

## 2016-05-25 NOTE — Assessment & Plan Note (Signed)
1) Anticipatory Guidance: Discussed importance of wearing a seatbelt while driving and not texting while driving; changing batteries in smoke detector at least once annually; wearing suntan lotion when outside; eating a balanced and moderate diet; getting physical activity at least 30 minutes per day.  2) Immunizations / Screenings / Labs:  MMR updated today per patient request. Declines influenza and tetanus. All other immunizations are up-to-date per recommendations. All screenings are up-to-date per recommendations. Obtain CMET and lipid profile.    Overall well exam with minimal risk factors for cardiovascular disease as she is overall healthy. She is currently working on becoming pregnant. Discussed importance of prenatal vitamin on a daily basis. Recommend continue other healthy lifestyle behaviors and choices. Follow-up prevention exam in 1 year. Follow-up office visit pending blood work if necessary.

## 2016-05-25 NOTE — Progress Notes (Signed)
Subjective:    Patient ID: Linda Herring, female    DOB: 23-Sep-1980, 36 y.o.   MRN: [id number]  Chief Complaint  Patient presents with  . Annual Exam    HPI:  Linda Herring is a 36 y.o. female who presents today for an annual wellness visit.   1) Health Maintenance -   Diet - Averaging about 2 meals per day consisting of a regular diet; Caffeine intake 2-3 cups per day.  Exercise - 3-4 times per week with primary cardiovascular.    2) Preventative Exams / Immunizations:  Dental -- Up to date  Vision -- Up to date   Health Maintenance  Topic Date Due  . HIV Screening  05/01/1994  . TETANUS/TDAP  05/01/1998  . INFLUENZA VACCINE  08/01/2016 (Originally 12/03/2015)  . PAP SMEAR  04/23/2019      There is no immunization history on file for this patient.   No Known Allergies   Outpatient Medications Prior to Visit  Medication Sig Dispense Refill  . meloxicam (MOBIC) 15 MG tablet Take 15 mg by mouth daily as needed for pain.    . methocarbamol (ROBAXIN) 500 MG tablet Take 1 tablet (500 mg total) by mouth 4 (four) times daily. 45 tablet 1  . traMADol (ULTRAM) 50 MG tablet Take 1-2 tablets (50-100 mg total) by mouth every 8 (eight) hours as needed. 30 tablet 1  . valACYclovir (VALTREX) 500 MG tablet Take 1 tablet (500 mg total) by mouth 2 (two) times daily. As directed for flare. 30 tablet 0   No facility-administered medications prior to visit.      Past Medical History:  Diagnosis Date  . Allergy   . Anemia   . Anorexia nervosa with bulimia   . Chicken pox   . Migraines   . Rash    Stress induced  . STD (sexually transmitted disease)    HSV1     Past Surgical History:  Procedure Laterality Date  . DILATION AND CURETTAGE OF UTERUS    . NASAL SEPTUM SURGERY    . TUMOR REMOVAL     from face     Family History  Problem Relation Age of Onset  . Lung cancer Father   . Diabetes Father   . Heart disease Maternal Grandmother   . Heart attack Maternal  Grandmother   . Other Paternal Grandfather     brain tumor  . Colon cancer Paternal Grandmother   . Cancer Sister     uterine      Social History   Social History  . Marital status: Married    Spouse name: N/A  . Number of children: 0  . Years of education: 67   Occupational History  . Customer Specialist at [employer]    Social History Main Topics  . Smoking status: Former Smoker    Years: 3.00  . Smokeless tobacco: Never Used     Comment: over 12 yrs ago  . Alcohol use 1.8 oz/week    2 Standard drinks or equivalent, 1 Glasses of wine per week  . Drug use: No  . Sexual activity: Yes    Partners: Male    Birth control/ protection: Condom   Other Topics Concern  . Not on file   Social History Narrative   Fun: Read, hike and travel.   Denies religious beliefs effecting health care.       Review of Systems  Constitutional: Denies fever, chills, fatigue, or significant weight gain/loss. HENT: Head: Denies headache  or neck pain Ears: Denies changes in hearing, ringing in ears, earache, drainage Nose: Denies discharge, stuffiness, itching, nosebleed, sinus pain Throat: Denies sore throat, hoarseness, dry mouth, sores, thrush Eyes: Denies loss/changes in vision, pain, redness, blurry/double vision, flashing lights Cardiovascular: Denies chest pain/discomfort, tightness, palpitations, shortness of breath with activity, difficulty lying down, swelling, sudden awakening with shortness of breath Respiratory: Denies shortness of breath, cough, sputum production, wheezing Gastrointestinal: Denies dysphasia, heartburn, change in appetite, nausea, change in bowel habits, rectal bleeding, constipation, diarrhea, yellow skin or eyes Genitourinary: Denies frequency, urgency, burning/pain, blood in urine, incontinence, change in urinary strength. Musculoskeletal: Denies muscle/joint pain, stiffness, back pain, redness or swelling of joints, trauma Skin: Denies rashes, lumps, itching,  dryness, color changes, or hair/nail changes Neurological: Denies dizziness, fainting, seizures, weakness, numbness, tingling, tremor Psychiatric - Denies nervousness, stress, depression or memory loss Endocrine: Denies heat or cold intolerance, sweating, frequent urination, excessive thirst, changes in appetite Hematologic: Denies ease of bruising or bleeding     Objective:     BP 102/72 (BP Location: Left Arm, Patient Position: Sitting, Cuff Size: Normal)   Pulse 67   Temp 98.6 F (37 C) (Oral)   Resp 16   Ht '5\' 5"'  (1.651 m)   Wt 148 lb (67.1 kg)   SpO2 96%   BMI 24.63 kg/m  Nursing note and vital signs reviewed.  Physical Exam  Constitutional: She is oriented to person, place, and time. She appears well-developed and well-nourished.  HENT:  Head: Normocephalic.  Right Ear: Hearing, tympanic membrane, external ear and ear canal normal.  Left Ear: Hearing, tympanic membrane, external ear and ear canal normal.  Nose: Nose normal.  Mouth/Throat: Uvula is midline, oropharynx is clear and moist and mucous membranes are normal.  Eyes: Conjunctivae and EOM are normal. Pupils are equal, round, and reactive to light.  Neck: Neck supple. No JVD present. No tracheal deviation present. No thyromegaly present.  Cardiovascular: Normal rate, regular rhythm, normal heart sounds and intact distal pulses.   Pulmonary/Chest: Effort normal and breath sounds normal.  Abdominal: Soft. Bowel sounds are normal. She exhibits no distension and no mass. There is no tenderness. There is no rebound and no guarding.  Musculoskeletal: Normal range of motion. She exhibits no edema or tenderness.  Lymphadenopathy:    She has no cervical adenopathy.  Neurological: She is alert and oriented to person, place, and time. She has normal reflexes. No cranial nerve deficit. She exhibits normal muscle tone. Coordination normal.  Skin: Skin is warm and dry.  Psychiatric: She has a normal mood and affect. Her behavior  is normal. Judgment and thought content normal.       Assessment & Plan:   Problem List Items Addressed This Visit      Other   Routine general medical examination at a health care facility    1) Anticipatory Guidance: Discussed importance of wearing a seatbelt while driving and not texting while driving; changing batteries in smoke detector at least once annually; wearing suntan lotion when outside; eating a balanced and moderate diet; getting physical activity at least 30 minutes per day.  2) Immunizations / Screenings / Labs:  MMR updated today per patient request. Declines influenza and tetanus. All other immunizations are up-to-date per recommendations. All screenings are up-to-date per recommendations. Obtain CMET and lipid profile.    Overall well exam with minimal risk factors for cardiovascular disease as she is overall healthy. She is currently working on becoming pregnant. Discussed importance of prenatal vitamin  on a daily basis. Recommend continue other healthy lifestyle behaviors and choices. Follow-up prevention exam in 1 year. Follow-up office visit pending blood work if necessary.       Relevant Orders   Comprehensive metabolic panel   Lipid panel       I have discontinued Ms. Fajardo's meloxicam, methocarbamol, traMADol, and valACYclovir.   Follow-up: Return in about 1 year (around 05/25/2017), or if symptoms worsen or fail to improve.   Mauricio Po, FNP

## 2016-05-25 NOTE — Patient Instructions (Signed)
Thank you for choosing [facility].  SUMMARY AND INSTRUCTIONS:  Recommend a pre-natal vitamin.   Labs:  Please stop by the lab on the lower level of the building for your blood work. Your results will be released to [place] (or called to you) after review, usually within 72 hours after test completion. If any changes need to be made, you will be notified at that same time.  1.) The lab is open from 7:30am to 5:30 pm Monday-Friday 2.) No appointment is necessary 3.) Fasting (if needed) is 6-8 hours after food and drink; black coffee and water are okay   Follow up:  If your symptoms worsen or fail to improve, please contact our office for further instruction, or in case of emergency go directly to the emergency room at the closest medical facility.    Health Maintenance, Female Introduction Adopting a healthy lifestyle and getting preventive care can go a long way to promote health and wellness. Talk with your health care provider about what schedule of regular examinations is right for you. This is a good chance for you to check in with your provider about disease prevention and staying healthy. In between checkups, there are plenty of things you can do on your own. Experts have done a lot of research about which lifestyle changes and preventive measures are most likely to keep you healthy. Ask your health care provider for more information. Weight and diet Eat a healthy diet  Be sure to include plenty of vegetables, fruits, low-fat dairy products, and lean protein.  Do not eat a lot of foods high in solid fats, added sugars, or salt.  Get regular exercise. This is one of the most important things you can do for your health.  Most adults should exercise for at least 150 minutes each week. The exercise should increase your heart rate and make you sweat (moderate-intensity exercise).  Most adults should also do strengthening exercises at least twice a week. This is in addition  to the moderate-intensity exercise. Maintain a healthy weight  Body mass index (BMI) is a measurement that can be used to identify possible weight problems. It estimates body fat based on height and weight. Your health care provider can help determine your BMI and help you achieve or maintain a healthy weight.  For females 21 years of age and older:  A BMI below 18.5 is considered underweight.  A BMI of 18.5 to 24.9 is normal.  A BMI of 25 to 29.9 is considered overweight.  A BMI of 30 and above is considered obese. Watch levels of cholesterol and blood lipids  You should start having your blood tested for lipids and cholesterol at 38 years of age, then have this test every 5 years.  You may need to have your cholesterol levels checked more often if:  Your lipid or cholesterol levels are high.  You are older than 38 years of age.  You are at high risk for heart disease. Cancer screening Lung Cancer  Lung cancer screening is recommended for adults 93-76 years old who are at high risk for lung cancer because of a history of smoking.  A yearly low-dose CT scan of the lungs is recommended for people who:  Currently smoke.  Have quit within the past 15 years.  Have at least a 30-pack-year history of smoking. A pack year is smoking an average of one pack of cigarettes a day for 1 year.  Yearly screening should continue until it has been 15  years since you quit.  Yearly screening should stop if you develop a health problem that would prevent you from having lung cancer treatment. Breast Cancer  Practice breast self-awareness. This means understanding how your breasts normally appear and feel.  It also means doing regular breast self-exams. Let your health care provider know about any changes, no matter how small.  If you are in your 20s or 30s, you should have a clinical breast exam (CBE) by a health care provider every 1-3 years as part of a regular health exam.  If you are  13 or older, have a CBE every year. Also consider having a breast X-ray (mammogram) every year.  If you have a family history of breast cancer, talk to your health care provider about genetic screening.  If you are at high risk for breast cancer, talk to your health care provider about having an MRI and a mammogram every year.  Breast cancer gene (BRCA) assessment is recommended for women who have family members with BRCA-related cancers. BRCA-related cancers include:  Breast.  Ovarian.  Tubal.  Peritoneal cancers.  Results of the assessment will determine the need for genetic counseling and BRCA1 and BRCA2 testing. Cervical Cancer  Your health care provider may recommend that you be screened regularly for cancer of the pelvic organs (ovaries, uterus, and vagina). This screening involves a pelvic examination, including checking for microscopic changes to the surface of your cervix (Pap test). You may be encouraged to have this screening done every 3 years, beginning at age 59.  For women ages 22-65, health care providers may recommend pelvic exams and Pap testing every 3 years, or they may recommend the Pap and pelvic exam, combined with testing for human papilloma virus (HPV), every 5 years. Some types of HPV increase your risk of cervical cancer. Testing for HPV may also be done on women of any age with unclear Pap test results.  Other health care providers may not recommend any screening for nonpregnant women who are considered low risk for pelvic cancer and who do not have symptoms. Ask your health care provider if a screening pelvic exam is right for you.  If you have had past treatment for cervical cancer or a condition that could lead to cancer, you need Pap tests and screening for cancer for at least 20 years after your treatment. If Pap tests have been discontinued, your risk factors (such as having a new sexual partner) need to be reassessed to determine if screening should resume.  Some women have medical problems that increase the chance of getting cervical cancer. In these cases, your health care provider may recommend more frequent screening and Pap tests. Colorectal Cancer  This type of cancer can be detected and often prevented.  Routine colorectal cancer screening usually begins at 38 years of age and continues through 38 years of age.  Your health care provider may recommend screening at an earlier age if you have risk factors for colon cancer.  Your health care provider may also recommend using home test kits to check for hidden blood in the stool.  A small camera at the end of a tube can be used to examine your colon directly (sigmoidoscopy or colonoscopy). This is done to check for the earliest forms of colorectal cancer.  Routine screening usually begins at age 89.  Direct examination of the colon should be repeated every 5-10 years through 38 years of age. However, you may need to be screened more often if early  forms of precancerous polyps or small growths are found. Skin Cancer  Check your skin from head to toe regularly.  Tell your health care provider about any new moles or changes in moles, especially if there is a change in a mole's shape or color.  Also tell your health care provider if you have a mole that is larger than the size of a pencil eraser.  Always use sunscreen. Apply sunscreen liberally and repeatedly throughout the day.  Protect yourself by wearing long sleeves, pants, a wide-brimmed hat, and sunglasses whenever you are outside. Heart disease, diabetes, and high blood pressure  High blood pressure causes heart disease and increases the risk of stroke. High blood pressure is more likely to develop in:  People who have blood pressure in the high end of the normal range (130-139/85-89 mm Hg).  People who are overweight or obese.  People who are African American.  If you are 71-54 years of age, have your blood pressure checked  every 3-5 years. If you are 40 years of age or older, have your blood pressure checked every year. You should have your blood pressure measured twice-once when you are at a hospital or clinic, and once when you are not at a hospital or clinic. Record the average of the two measurements. To check your blood pressure when you are not at a hospital or clinic, you can use:  An automated blood pressure machine at a pharmacy.  A home blood pressure monitor.  If you are between 51 years and 45 years old, ask your health care provider if you should take aspirin to prevent strokes.  Have regular diabetes screenings. This involves taking a blood sample to check your fasting blood sugar level.  If you are at a normal weight and have a low risk for diabetes, have this test once every three years after 38 years of age.  If you are overweight and have a high risk for diabetes, consider being tested at a younger age or more often. Preventing infection Hepatitis B  If you have a higher risk for hepatitis B, you should be screened for this virus. You are considered at high risk for hepatitis B if:  You were born in a country where hepatitis B is common. Ask your health care provider which countries are considered high risk.  Your parents were born in a high-risk country, and you have not been immunized against hepatitis B (hepatitis B vaccine).  You have HIV or AIDS.  You use needles to inject street drugs.  You live with someone who has hepatitis B.  You have had sex with someone who has hepatitis B.  You get hemodialysis treatment.  You take certain medicines for conditions, including cancer, organ transplantation, and autoimmune conditions. Hepatitis C  Blood testing is recommended for:  Everyone born from [birth year] through 1967.  Anyone with known risk factors for hepatitis C. Sexually transmitted infections (STIs)  You should be screened for sexually transmitted infections (STIs) including  gonorrhea and chlamydia if:  You are sexually active and are younger than 38 years of age.  You are older than 38 years of age and your health care provider tells you that you are at risk for this type of infection.  Your sexual activity has changed since you were last screened and you are at an increased risk for chlamydia or gonorrhea. Ask your health care provider if you are at risk.  If you do not have HIV, but are at risk,  it may be recommended that you take a prescription medicine daily to prevent HIV infection. This is called pre-exposure prophylaxis (PrEP). You are considered at risk if:  You are sexually active and do not regularly use condoms or know the HIV status of your partner(s).  You take drugs by injection.  You are sexually active with a partner who has HIV. Talk with your health care provider about whether you are at high risk of being infected with HIV. If you choose to begin PrEP, you should first be tested for HIV. You should then be tested every 3 months for as long as you are taking PrEP. Pregnancy  If you are premenopausal and you may become pregnant, ask your health care provider about preconception counseling.  If you may become pregnant, take 400 to 800 micrograms (mcg) of folic acid every day.  If you want to prevent pregnancy, talk to your health care provider about birth control (contraception). Osteoporosis and menopause  Osteoporosis is a disease in which the bones lose minerals and strength with aging. This can result in serious bone fractures. Your risk for osteoporosis can be identified using a bone density scan.  If you are 48 years of age or older, or if you are at risk for osteoporosis and fractures, ask your health care provider if you should be screened.  Ask your health care provider whether you should take a calcium or vitamin D supplement to lower your risk for osteoporosis.  Menopause may have certain physical symptoms and risks.  Hormone  replacement therapy may reduce some of these symptoms and risks. Talk to your health care provider about whether hormone replacement therapy is right for you. Follow these instructions at home:  Schedule regular health, dental, and eye exams.  Stay current with your immunizations.  Do not use any tobacco products including cigarettes, chewing tobacco, or electronic cigarettes.  If you are pregnant, do not drink alcohol.  If you are breastfeeding, limit how much and how often you drink alcohol.  Limit alcohol intake to no more than 1 drink per day for nonpregnant women. One drink equals 12 ounces of beer, 5 ounces of wine, or 1 ounces of hard liquor.  Do not use street drugs.  Do not share needles.  Ask your health care provider for help if you need support or information about quitting drugs.  Tell your health care provider if you often feel depressed.  Tell your health care provider if you have ever been abused or do not feel safe at home. This information is not intended to replace advice given to you by your health care provider. Make sure you discuss any questions you have with your health care provider. Document Released: 11/03/2010 Document Revised: 09/26/2015 Document Reviewed: 01/22/2015  2017 Elsevier

## 2016-06-16 DIAGNOSIS — J189 Pneumonia, unspecified organism: Secondary | ICD-10-CM | POA: Diagnosis not present

## 2016-06-16 DIAGNOSIS — R0989 Other specified symptoms and signs involving the circulatory and respiratory systems: Secondary | ICD-10-CM | POA: Diagnosis not present

## 2016-06-18 DIAGNOSIS — J189 Pneumonia, unspecified organism: Secondary | ICD-10-CM | POA: Diagnosis not present

## 2016-06-21 NOTE — Progress Notes (Deleted)
   Subjective:    Patient ID: Linda Herring, female    DOB: [birth year], 36 y.o.   MRN: [id number]  No chief complaint on file.   HPI:  Linda Herring is a 36 y.o. female who  has a past medical history of Allergy; Anemia; Anorexia nervosa with bulimia; Chicken pox; Migraines; Rash; and STD (sexually transmitted disease). and presents today for a follow up office visit.     No Known Allergies    No outpatient prescriptions prior to visit.   No facility-administered medications prior to visit.       Past Surgical History:  Procedure Laterality Date  . DILATION AND CURETTAGE OF UTERUS    . NASAL SEPTUM SURGERY    . TUMOR REMOVAL     from face      Past Medical History:  Diagnosis Date  . Allergy   . Anemia   . Anorexia nervosa with bulimia   . Chicken pox   . Migraines   . Rash    Stress induced  . STD (sexually transmitted disease)    HSV1      Review of Systems    Objective:    There were no vitals taken for this visit. Nursing note and vital signs reviewed.  Physical Exam  Constitutional: She is oriented to person, place, and time. She appears well-developed and well-nourished. No distress.  Cardiovascular: Normal rate, regular rhythm, normal heart sounds and intact distal pulses.   Pulmonary/Chest: Effort normal and breath sounds normal.  Neurological: She is alert and oriented to person, place, and time.  Skin: Skin is warm and dry.  Psychiatric: She has a normal mood and affect. Her behavior is normal. Judgment and thought content normal.       Assessment & Plan:   Problem List Items Addressed This Visit    None       Ms. Joselyn Arrow does not currently have medications on file.   No orders of the defined types were placed in this encounter.    Follow-up: No Follow-up on file.  Mauricio Po, FNP

## 2016-06-22 ENCOUNTER — Ambulatory Visit: Payer: Commercial Managed Care - HMO | Admitting: Family

## 2016-06-28 DIAGNOSIS — B379 Candidiasis, unspecified: Secondary | ICD-10-CM | POA: Diagnosis not present

## 2016-06-28 DIAGNOSIS — J0141 Acute recurrent pansinusitis: Secondary | ICD-10-CM | POA: Diagnosis not present

## 2016-06-28 DIAGNOSIS — R05 Cough: Secondary | ICD-10-CM | POA: Diagnosis not present

## 2016-11-02 ENCOUNTER — Telehealth: Payer: Self-pay | Admitting: Certified Nurse Midwife

## 2016-11-02 NOTE — Telephone Encounter (Signed)
Patient called with concerns about breast pain and tingling.   Last seen: 04/22/16

## 2016-11-02 NOTE — Telephone Encounter (Signed)
Spoke with patient. Patient states 2 weeks ago she began having left breast pain. Has done SBE and has not felt any lumps. Is having slight swelling in breasts, but is due for menses in 3 days. Denies any redness or nipple discharge. States "I thought it was due to my cycle, but it feels different." Advised will need to be seen for breast check. Appointment scheduled for 11/03/2016 at 8:30 am with Melvia Heaps CNM. Patient is agreeable to date and time.  Routing to provider for final review. Patient agreeable to disposition. Will close encounter.

## 2016-11-03 ENCOUNTER — Ambulatory Visit (INDEPENDENT_AMBULATORY_CARE_PROVIDER_SITE_OTHER): Payer: 59 | Admitting: Certified Nurse Midwife

## 2016-11-03 ENCOUNTER — Encounter: Payer: Self-pay | Admitting: Certified Nurse Midwife

## 2016-11-03 VITALS — BP 110/70 | HR 60 | Resp 16 | Ht 65.0 in | Wt 154.0 lb

## 2016-11-03 DIAGNOSIS — N644 Mastodynia: Secondary | ICD-10-CM | POA: Diagnosis not present

## 2016-11-03 NOTE — Progress Notes (Signed)
   Subjective:   36 y.o. MarriedHispanic female presents for evaluation of left breast tenderness/pinching sensation for the past 2 weeks. Period due in 3 days, so breast is tender from that normally, but this is different... Patient sought evaluation because of breast tenderness.  Contributing factors include none. Denies chills, fatigue and fevers. Patient denies hiistory of trauma, bites, or injuries. Last mammogram was never..  Previous evaluation has included no workup. No increase in caffeine or bra change. Just feel this "is different". No family history of breast cancer. No other concerns today.   Review of Systems Pertinent to HPI  Objective:   General appearance: alert, cooperative, appears stated age, no distress and mild distress Breasts: normal appearance, no masses or tenderness, No nipple retraction or dimpling, No nipple discharge or bleeding, No axillary or supraclavicular adenopathy, tenderness in left breast around aerola area at 7-8 o'clock, only   ASSESSMENT:Patient is diagnosed with normal breast exam and with left breast tenderness   Plan:   PLAN: Reviewed with patient need to evaluate after menses for diagnostic mammogram/ [place] of left breast. Patient will be scheduled prior to leaving. Questions were addressed.  Rv prn

## 2016-11-03 NOTE — Patient Instructions (Signed)

## 2016-11-03 NOTE — Progress Notes (Signed)
Patient scheduled while in office for bilateral diagnostic MMG and left breast US. Spoke with Alaina at [facility]. Patient scheduled for 11/09/16 arriving at 7:30am for 7:50am appointment. Patient is agreeable to date and time.

## 2016-11-09 ENCOUNTER — Ambulatory Visit
Admission: RE | Admit: 2016-11-09 | Discharge: 2016-11-09 | Disposition: A | Discharge status: Home / Self Care | Payer: 59 | Source: Ambulatory Visit | Attending: Certified Nurse Midwife | Admitting: Certified Nurse Midwife

## 2016-11-09 DIAGNOSIS — N6489 Other specified disorders of breast: Secondary | ICD-10-CM | POA: Diagnosis not present

## 2016-11-09 DIAGNOSIS — N644 Mastodynia: Secondary | ICD-10-CM

## 2016-11-09 DIAGNOSIS — R922 Inconclusive mammogram: Secondary | ICD-10-CM | POA: Diagnosis not present

## 2016-11-13 ENCOUNTER — Telehealth: Payer: Self-pay

## 2016-11-13 NOTE — Telephone Encounter (Signed)
-----   Message from Regina Eck, CNM sent at 11/11/2016 10:06 PM EDT ----- Mammogram and [place] benign finding and patient felt tenderness improved. Needs follow up in office for recheck in 2 weeks

## 2016-11-13 NOTE — Telephone Encounter (Signed)
Left detailed message at [phone], okay per ROI. Advised of results and need to keep follow up appointment as scheduled for 12/22/2016 with Melvia Heaps CNM. Patient removed from mammogram hold.  Will close encounter.

## 2016-12-22 ENCOUNTER — Ambulatory Visit (INDEPENDENT_AMBULATORY_CARE_PROVIDER_SITE_OTHER): Payer: 59 | Admitting: Certified Nurse Midwife

## 2016-12-22 ENCOUNTER — Encounter: Payer: Self-pay | Admitting: Certified Nurse Midwife

## 2016-12-22 VITALS — BP 100/60 | HR 68 | Resp 16 | Ht 65.0 in | Wt 157.0 lb

## 2016-12-22 DIAGNOSIS — N644 Mastodynia: Secondary | ICD-10-CM

## 2016-12-22 DIAGNOSIS — Z1231 Encounter for screening mammogram for malignant neoplasm of breast: Secondary | ICD-10-CM

## 2016-12-22 DIAGNOSIS — Z1239 Encounter for other screening for malignant neoplasm of breast: Secondary | ICD-10-CM

## 2016-12-22 NOTE — Patient Instructions (Signed)
Sensibilidad en las mamas  (Breast Tenderness)  La sensibilidad en las mamas es un problema frecuente en las mujeres de todas las edades. y puede causar molestias leves o dolor intenso. Sus causas son variadas. Su médico determinará la causa probable de la sensibilidad mediante el examen de las mamas, las preguntas sobre los síntomas y la indicación de algunos estudios. Por lo general, la sensibilidad en las mamas no significa que tenga cáncer de mama.  INSTRUCCIONES PARA EL CUIDADO EN EL HOGAR   A menudo, la sensibilidad en las mamas puede tratarse en el hogar. Puede intentar lo siguiente:  · Probarse un nuevo sostén que le brinde más sujeción, especialmente mientras hace actividad física.  · Usar un sostén con mejor sujeción o uno deportivo mientras duerme cuando las mamas están muy sensibles.  · Si tiene una lesión mamaria, aplique hielo en la zona:  ? Ponga el hielo en una bolsa plástica.  ? Colóquese una toalla entre la piel y la bolsa de hielo.  ? Deje el hielo durante 20 minutos y aplíquelo 2 a 3 veces por día.  · Si tiene las mamas repletas de leche debido a la lactancia, intente lo siguiente:  ? Extráigase leche manualmente o con un sacaleche.  ? Aplíquese una compresa tibia en las mamas para ayudar a la descarga.  · Tome analgésicos de venta libre si su médico lo autoriza.  · Tome otros medicamentos que su médico le recete, entre ellos, antibióticos o anticonceptivos.  A largo plazo, puede aliviar la sensibilidad en las mamas si hace lo siguiente:  · Disminuye el consumo de cafeína.  · Disminuye la cantidad de grasa de la dieta.  Lleva un registro de los días y las horas cuando tiene mayor sensibilidad en las mamas. Esto será de ayuda para que usted y su médico encuentren la causa de la sensibilidad y cómo aliviarla. Además, aprenda cómo examinarse las mamas en casa. Esto la ayudará a palpar un crecimiento o un bulto fuera de lo normal que podría causar la sensibilidad.  SOLICITE ATENCIÓN MÉDICA SI:    · Cualquier zona de la mama está dura, enrojecida y caliente al tacto. Puede ser un signo de infección.  · Hay secreción de los pezones (y no está amamantando). En especial, vigile la secreción de sangre o pus.  · Tiene fiebre, además de sensibilidad en las mamas.  · Tiene un bulto nuevo o doloroso en la mama que no desaparece después de la finalización del período menstrual.  · Ha intentando controlar el dolor en casa, pero no desaparece.  · El dolor de la mama es más intenso o le dificulta hacer las cosas que hace habitualmente durante el día.  Esta información no tiene como fin reemplazar el consejo del médico. Asegúrese de hacerle al médico cualquier pregunta que tenga.  Document Released: 02/08/2013  Elsevier Interactive Patient Education © 2017 [employer]vier Inc[employer]

## 2016-12-22 NOTE — Progress Notes (Signed)
   Subjective:   36 y.o. Married Hispanic female presents for follow up of left breast tenderness noted on 11/03/16 prior to period that was different. Patient had also had a pet jump on her left breast  prior to tenderness change. Mammogram and [place] were both normal breast finding with no suspicious findings.. Patient has not experienced this type of tenderness with menses again and has continued with SBE's. Denies nipple discharge or masses or skin change.  Review of Systems Pertinent items are noted in HPI.@SUBJECTIVE    Objective:   General appearance: alert, cooperative, appears stated age and no distress  Breasts: normal appearance, no masses or tenderness, No nipple retraction or dimpling, No nipple discharge or bleeding, No axillary or supraclavicular adenopathy, bilateral and no tenderness in left breast area as seen before   Assessment:   ASSESSMENT:Patient is diagnosed with normal breast exam and normal premenstrual breast tenderness excerabated due to trauma from pet jumping on her at previous visit. Normal mammogram and [place] finding.   Plan:   PLAN: Patient will continue SBE, given information on breast tenderness. Questions addressed. Repeat mammogram at age 24 unless other changes.   Results as follows from mammogram and [place] : Diagnostic Bilateral MMG 11/09/16 BIRADS1:neg  [place] Left Breast 11/09/16 BIRADS1:neg. [place] Density Category C. F/u age 22.   Rv prn

## 2017-04-22 NOTE — Progress Notes (Addendum)
36 y.o. G71P0030 Married  Hispanic Fe here for annual exam. Periods normals, no issues. No contraception now. Trying for pregnancy now. On prenatal vitamins now and taking Spirolena OTC. Has been plotting the ovulation and has been able assess. Urinary frequency, but has been taking apple cider vinegar, stopped and is better.No dysuria. Sees PCP yearly and establish with another PCP. No health issues today.   Patient's last menstrual period was 04/04/2017 (exact date).          Sexually active: Yes.    The current method of family planning is none.    Exercising: Yes.    full body Smoker:  no  Health Maintenance: Pap:  03-28-14 neg HPV HR neg, 04-22-16 neg History of Abnormal Pap: no MMG:  7/18 bilateral & left breast u/s category c density birads 1:neg Self Breast exams: yes Colonoscopy:  none BMD:   none TDaP:  2016 Shingles: no Pneumonia: no Hep C and HIV: HIV neg 87yrs ago Labs: poct urine-rbc tr   reports that she has quit smoking. She quit after 3.00 years of use. she has never used smokeless tobacco. She reports that she drinks alcohol. She reports that she does not use drugs.  Past Medical History:  Diagnosis Date  . Allergy   . Anemia   . Anorexia nervosa with bulimia   . Chicken pox   . Migraines   . Pneumonia   . Rash    Stress induced  . STD (sexually transmitted disease)    HSV1    Past Surgical History:  Procedure Laterality Date  . DILATION AND CURETTAGE OF UTERUS    . NASAL SEPTUM SURGERY    . TUMOR REMOVAL     from face    Current Outpatient Medications  Medication Sig Dispense Refill  . Prenatal Vit-Fe Fumarate-FA (PRENATAL VITAMIN PO) Take by mouth.     No current facility-administered medications for this visit.     Family History  Problem Relation Age of Onset  . Lung cancer Father   . Diabetes Father   . Heart disease Maternal Grandmother   . Heart attack Maternal Grandmother   . Other Paternal Grandfather        brain tumor  . Colon  cancer Paternal Grandmother   . Cancer Sister        uterine     ROS:  Pertinent items are noted in HPI.  Otherwise, a comprehensive ROS was negative.  Exam:   BP 110/72   Pulse 68   Resp 16   Ht 5\' 5"  (1.651 m)   Wt 159 lb (72.1 kg)   LMP 04/04/2017 (Exact Date)   BMI 26.46 kg/m  Height: 5\' 5"  (165.1 cm) Ht Readings from Last 3 Encounters:  04/23/17 5\' 5"  (1.651 m)  12/22/16 5\' 5"  (1.651 m)  11/03/16 5\' 5"  (1.651 m)    General appearance: alert, cooperative and appears stated age Head: Normocephalic, without obvious abnormality, atraumatic Neck: no adenopathy, supple, symmetrical, trachea midline and thyroid normal to inspection and palpation Lungs: clear to auscultation bilaterally Breasts: normal appearance, no masses or tenderness, No nipple retraction or dimpling, No nipple discharge or bleeding, No axillary or supraclavicular adenopathy Heart: regular rate and rhythm Abdomen: soft, non-tender; no masses,  no organomegaly Extremities: extremities normal, atraumatic, no cyanosis or edema Skin: Skin color, texture, turgor normal. No rashes or lesions Lymph nodes: Cervical, supraclavicular, and axillary nodes normal. No abnormal inguinal nodes palpated Neurologic: Grossly normal   Pelvic: External genitalia:  no  lesions              Urethra:  normal appearing urethra with no masses, tenderness or lesions              Bartholin's and Skene's: normal                 Vagina: normal appearing vagina with normal color and discharge, no lesions              Cervix: no cervical motion tenderness, no lesions and normal appearance              Pap taken: No. Bimanual Exam:  Uterus:  normal size, contour, position, consistency, mobility, non-tender              Adnexa: normal adnexa and no mass, fullness, tenderness               Rectovaginal: Confirms               Anus:  normal sphincter tone, no lesions  Chaperone present: yes  A:  Well Woman with normal  exam  Contraception none trying for pregnancy  AMA history of SAB in past  R/O hematuria  P:   Reviewed health and wellness pertinent to exam  Discussed with patient to advise if no pregnancy in next 6 months, due to [facility] and may need further evaluation. Patient also aware to come in with positive UPT for evaluation.  Warning signs of UTI given and need to advise.  Lab : Urine micro  Pap smear: no  counseled on breast self exam, feminine hygiene, adequate intake of calcium and vitamin D, diet and exercise  return annually or prn  An After Visit Summary was printed and given to the patient.

## 2017-04-23 ENCOUNTER — Ambulatory Visit: Payer: 59 | Admitting: Certified Nurse Midwife

## 2017-04-23 ENCOUNTER — Encounter: Payer: Self-pay | Admitting: Certified Nurse Midwife

## 2017-04-23 ENCOUNTER — Other Ambulatory Visit: Payer: Self-pay

## 2017-04-23 VITALS — BP 110/72 | HR 68 | Resp 16 | Ht 65.0 in | Wt 159.0 lb

## 2017-04-23 DIAGNOSIS — Z0000 Encounter for general adult medical examination without abnormal findings: Secondary | ICD-10-CM

## 2017-04-23 DIAGNOSIS — Z01419 Encounter for gynecological examination (general) (routine) without abnormal findings: Secondary | ICD-10-CM

## 2017-04-23 DIAGNOSIS — R319 Hematuria, unspecified: Secondary | ICD-10-CM | POA: Diagnosis not present

## 2017-04-23 LAB — POCT URINALYSIS DIPSTICK
BILIRUBIN UA: NEGATIVE
GLUCOSE UA: NEGATIVE
Ketones, UA: NEGATIVE
Leukocytes, UA: NEGATIVE
Nitrite, UA: NEGATIVE
ODOR: NORMAL
PH UA: 5 (ref 5.0–8.0)
Protein, UA: NEGATIVE
UROBILINOGEN UA: NEGATIVE U/dL — AB

## 2017-04-23 NOTE — Patient Instructions (Signed)

## 2017-04-24 LAB — URINALYSIS, MICROSCOPIC ONLY: Casts: NONE SEEN /lpf

## 2017-07-12 ENCOUNTER — Telehealth: Payer: Self-pay | Admitting: Certified Nurse Midwife

## 2017-07-12 NOTE — Telephone Encounter (Signed)
Spoke with patient. LMP 06/06/17. UPT x2 positive on 3/10. Scheduled for OV for pregnancy confirmation on 3/12 with Melvia Heaps, CNM, hx of miscarriages, has additional questions.  Reports "mild menses like cramping" Saturday night, has since resolved. Denies any other symptoms or bleeding. ER precautions reviewed and given to patient, advised Melvia Heaps, CNM is out of the office today, covering provider will review, will return call with any additional recommendations.   Routing to covering provider. Patient is agreeable to disposition. Will close encounter.  Cc: Melvia Heaps, CNM

## 2017-07-12 NOTE — Telephone Encounter (Signed)
Patient called to report she had a positive urine pregnancy test. She scheduled an appointment with Melvia Heaps, CNM on 07/13/17 at 11:00 AM.   Routing to triage per patient request since she has had "multiple miscarriages before." She did not report any specific concerns at this time but does want to check in with the nurse.  LMP: 06/06/17

## 2017-07-13 ENCOUNTER — Other Ambulatory Visit: Payer: Self-pay

## 2017-07-13 ENCOUNTER — Encounter: Payer: Self-pay | Admitting: Certified Nurse Midwife

## 2017-07-13 ENCOUNTER — Ambulatory Visit (INDEPENDENT_AMBULATORY_CARE_PROVIDER_SITE_OTHER): Payer: 59 | Admitting: Certified Nurse Midwife

## 2017-07-13 VITALS — BP 120/78 | HR 68 | Resp 16 | Ht 65.0 in | Wt 151.0 lb

## 2017-07-13 DIAGNOSIS — Z3201 Encounter for pregnancy test, result positive: Secondary | ICD-10-CM

## 2017-07-13 DIAGNOSIS — N912 Amenorrhea, unspecified: Secondary | ICD-10-CM

## 2017-07-13 LAB — POCT URINE PREGNANCY: Preg Test, Ur: POSITIVE — AB

## 2017-07-13 NOTE — Patient Instructions (Signed)

## 2017-07-13 NOTE — Progress Notes (Signed)
37 y.o.marital [place] female [id number]( with previous spouse many years ago) presents with amenorrhea with + UPT x 2 on 07/11/17 . LMP 06/06/17. Planned pregnancy  Complaining of breast tenderness, fatigue, nausea. Denies spotting, bleeding or cramping. Medications she is taking are: Prenatal vitamins.  Patient had stopped alcohol, all sodas, limiting caffeine use unhealthy carbohydrates and eating better with protein prior to pregnancy Spouse supportive. All other family is in [place]. Occasional cramping feels related to GI , no bleeding or increase discharge. Fatigue has increased but does exercise with walking.  No HSV outbreaks and aware of Valtrex not recommended in early pregnancy and will need to check with provider prior to use. No other health issues today.  ROS Pertinent to HPI  O: HPI pertinent to above. Healthy WDWN female Affect: normal, orientation x 3  Last Aex: 04/23/17 all normal Pap smear: 04/22/16    negative       Rubella screen: [facility] 05/25/2016  A: Amenorrhea with positive UPT  5 wk 1 d days per LNMP with [facility] 03/12/18. Planned pregnancy, Spouse excited(not with her) History of HSV    P: Reviewed with patient importance of prenatal care during pregnancy. Given OB provider list. Reviewed nutrition importance of pregnancy and selecting from all food groups and making sure to have adequate protein intake daily. Discussed avoiding raw or exotic fish, soft cheeses due to risk of bacteria . Discussed small frequent meals to help with digestion. Discussed concerns with FAS with alcohol use in pregnancy. Discussed increase of IUGR and SIDS with smoking use or second smoke. Reviewed warning signs of early pregnancy and need to advise if occurs. Discussed comfort measures for early pregnancy changes. Offered viability PUS here prior to initiating prenatal care. Patient plans to have PUS. She will be called with insurance information and scheduled. Questions addressed at length about care  providers, expectations and diet..  Labs: none  Rv prn   28 minutes Time spent with patient in face to face counseling regarding pregnancy and prenatal care

## 2017-07-14 ENCOUNTER — Other Ambulatory Visit: Payer: Self-pay | Admitting: *Deleted

## 2017-07-14 ENCOUNTER — Encounter: Payer: Self-pay | Admitting: Certified Nurse Midwife

## 2017-07-14 ENCOUNTER — Telehealth: Payer: Self-pay | Admitting: Certified Nurse Midwife

## 2017-07-14 DIAGNOSIS — N912 Amenorrhea, unspecified: Secondary | ICD-10-CM

## 2017-07-14 DIAGNOSIS — Z3201 Encounter for pregnancy test, result positive: Secondary | ICD-10-CM

## 2017-07-14 NOTE — Telephone Encounter (Signed)
Patient sent the following correspondence through [place]. Patient also called and left a message during lunch. Routing to triage to assist patient with request.  ----- Message from [place], Generic sent at 07/14/2017 1:15 PM EDT -----    I have had cramps since last night in both sizes, there has been no bleeding. What do you recommend?   Last seen: 07/13/17

## 2017-07-14 NOTE — Telephone Encounter (Signed)
Spoke with patient, denies Hx of infertility, GC/CT. Reports pain currently 2/10. ER precautions reviewed, patient verbalizes understanding and is agreeable.   Routing to provider for final review. Patient is agreeable to disposition. Will close encounter.

## 2017-07-14 NOTE — Telephone Encounter (Signed)
If she is having significant cramping she needs a BhcG. Check on history of infertility, GC/CT, if she has any of those she needs a BhcG. Also needs to alert [place] with any bleeding. She needs to be seen in ER if she is having significant pain over night.

## 2017-07-14 NOTE — Telephone Encounter (Signed)
Spoke with patient, seen in office on 3/12, positive pregnancy test, 5w 2d. Reports pelvic cramping 4/10 and leg cramping started last night. Used heating pad for comfort. Denies redness, swelling or warmth in legs, leg pain has resolved. Cramping has improved since sending MyChart message.   Denies bleeding, d/c, N/V, fever/chills.   ER precautions provided, advised should pain increase or bleeding occur return call to office, if after hours, seek immediate care at [facility] or local ER. Advised will review with provider and return call with any additional recommendations. Is scheduled for PUS on 3/26 with Dr. Talbert Nan.   Routing to provider for final review. Patient is agreeable to disposition. Will close encounter.  Cc: Melvia Heaps, CNM

## 2017-07-24 ENCOUNTER — Inpatient Hospital Stay (HOSPITAL_COMMUNITY): Payer: 59

## 2017-07-24 ENCOUNTER — Encounter (HOSPITAL_COMMUNITY): Payer: Self-pay | Admitting: Student

## 2017-07-24 ENCOUNTER — Telehealth: Payer: Self-pay | Admitting: Obstetrics & Gynecology

## 2017-07-24 ENCOUNTER — Inpatient Hospital Stay (HOSPITAL_COMMUNITY)
Admission: AD | Admit: 2017-07-24 | Discharge: 2017-07-24 | Disposition: A | Discharge status: Home / Self Care | Payer: 59 | Source: Ambulatory Visit | Attending: Obstetrics & Gynecology | Admitting: Obstetrics & Gynecology

## 2017-07-24 ENCOUNTER — Other Ambulatory Visit: Payer: Self-pay

## 2017-07-24 DIAGNOSIS — O209 Hemorrhage in early pregnancy, unspecified: Secondary | ICD-10-CM

## 2017-07-24 DIAGNOSIS — O26851 Spotting complicating pregnancy, first trimester: Secondary | ICD-10-CM | POA: Diagnosis not present

## 2017-07-24 DIAGNOSIS — O200 Threatened abortion: Secondary | ICD-10-CM | POA: Diagnosis not present

## 2017-07-24 DIAGNOSIS — Z3A00 Weeks of gestation of pregnancy not specified: Secondary | ICD-10-CM | POA: Diagnosis not present

## 2017-07-24 DIAGNOSIS — Z3A01 Less than 8 weeks gestation of pregnancy: Secondary | ICD-10-CM | POA: Diagnosis not present

## 2017-07-24 LAB — URINALYSIS, ROUTINE W REFLEX MICROSCOPIC
Bilirubin Urine: NEGATIVE
GLUCOSE, UA: NEGATIVE mg/dL
KETONES UR: NEGATIVE mg/dL
Leukocytes, UA: NEGATIVE
Nitrite: NEGATIVE
PROTEIN: NEGATIVE mg/dL
Specific Gravity, Urine: 1.011 (ref 1.005–1.030)
pH: 7 (ref 5.0–8.0)

## 2017-07-24 LAB — CBC
HEMATOCRIT: 38.3 % (ref 36.0–46.0)
HEMOGLOBIN: 12.7 g/dL (ref 12.0–15.0)
MCH: 30.9 pg (ref 26.0–34.0)
MCHC: 33.2 g/dL (ref 30.0–36.0)
MCV: 93.2 fL (ref 78.0–100.0)
Platelets: 342 10*3/uL (ref 150–400)
RBC: 4.11 MIL/uL (ref 3.87–5.11)
RDW: 12.9 % (ref 11.5–15.5)
WBC: 8.5 10*3/uL (ref 4.0–10.5)

## 2017-07-24 LAB — HCG, QUANTITATIVE, PREGNANCY: hCG, Beta Chain, Quant, S: 76 m[IU]/mL — ABNORMAL HIGH (ref <5)

## 2017-07-24 NOTE — MAU Note (Addendum)
Pt. Referred to come in to be assessed via Dr. Sabra Heck, brown discharge, first spotting with is morning at 0700.

## 2017-07-24 NOTE — MAU Provider Note (Signed)
History     CSN: [id number]  Arrival date and time: 07/24/17 5638   First Provider Initiated Contact with Patient 07/24/17 1840      Chief Complaint  Patient presents with  . Vaginal Bleeding   HPI Linda Herring is a 37 y.o. G4P0030 at [redacted]w[redacted]d by LMP who presents with vaginal bleeding.  Symptoms began this morning. Reports vaginal spotting on toilet paper when she wipes. Blood is brown in color. Is not saturating pads or passing clots. Reports some lower abdominal cramping that started a few hours ago. Denies pain at this time. Denies n/v/d, constipation, recent intercourse, or vaginal discharge. Patient concerned d/t history of recurrent miscarriage.    OB History    Gravida  4   Para      Term      Preterm      AB  3   Living  0     SAB  3   TAB      Ectopic      Multiple      Live Births              Past Medical History:  Diagnosis Date  . Allergy   . Anemia   . Anorexia nervosa with bulimia   . Chicken pox   . Migraines   . Pneumonia   . Rash    Stress induced  . STD (sexually transmitted disease)    HSV1    Past Surgical History:  Procedure Laterality Date  . DILATION AND CURETTAGE OF UTERUS    . NASAL SEPTUM SURGERY    . TUMOR REMOVAL     from face    Family History  Problem Relation Age of Onset  . Lung cancer Father   . Diabetes Father   . Heart disease Maternal Grandmother   . Heart attack Maternal Grandmother   . Other Paternal Grandfather        brain tumor  . Colon cancer Paternal Grandmother   . Cancer Sister        uterine     Social History   Tobacco Use  . Smoking status: Former Smoker    Years: 3.00  . Smokeless tobacco: Never Used  . Tobacco comment: over 12 yrs ago  Substance Use Topics  . Alcohol use: No    Frequency: Never  . Drug use: No    Allergies: No Known Allergies  Medications Prior to Admission  Medication Sig Dispense Refill Last Dose  . Prenatal Vit-Fe Fumarate-FA (PRENATAL VITAMIN PO) Take  by mouth.   Taking    Review of Systems  Constitutional: Negative.   Gastrointestinal: Positive for abdominal pain (none at this time). Negative for constipation, diarrhea, nausea and vomiting.  Genitourinary: Positive for vaginal bleeding. Negative for dysuria and vaginal discharge.   Physical Exam   Blood pressure 122/64, pulse 62, temperature 98.7 F (37.1 C), temperature source Oral, resp. rate 18, height 5\' 5"  (1.651 m), weight 155 lb 8 oz (70.5 kg), last menstrual period 06/06/2017, SpO2 100 %.  Physical Exam  Nursing note and vitals reviewed. Constitutional: She is oriented to person, place, and time. She appears well-developed and well-nourished. No distress.  HENT:  Head: Normocephalic and atraumatic.  Eyes: Conjunctivae are normal. Right eye exhibits no discharge. Left eye exhibits no discharge. No scleral icterus.  Neck: Normal range of motion.  Respiratory: Effort normal. No respiratory distress.  GI: Soft. She exhibits no distension. There is no tenderness.  Genitourinary: Cervix exhibits no  motion tenderness and no friability. There is bleeding (smalle amount of dark brown thick blood in vault; no active bleeding) in the vagina. No vaginal discharge found.  Genitourinary Comments: Cervix closed  Neurological: She is alert and oriented to person, place, and time.  Skin: Skin is warm and dry. She is not diaphoretic.  Psychiatric: She has a normal mood and affect. Her behavior is normal. Judgment and thought content normal.    MAU Course  Procedures Results for orders placed or performed during the hospital encounter of 07/24/17 (from the past 24 hour(s))  Urinalysis, Routine w reflex microscopic     Status: Abnormal   Collection Time: 07/24/17  6:22 PM  Result Value Ref Range   Color, Urine STRAW (A) YELLOW   APPearance CLEAR CLEAR   Specific Gravity, Urine 1.011 1.005 - 1.030   pH 7.0 5.0 - 8.0   Glucose, UA NEGATIVE NEGATIVE mg/dL   Hgb urine dipstick LARGE (A)  NEGATIVE   Bilirubin Urine NEGATIVE NEGATIVE   Ketones, ur NEGATIVE NEGATIVE mg/dL   Protein, ur NEGATIVE NEGATIVE mg/dL   Nitrite NEGATIVE NEGATIVE   Leukocytes, UA NEGATIVE NEGATIVE   RBC / HPF 0-5 0 - 5 RBC/hpf   WBC, UA 0-5 0 - 5 WBC/hpf   Bacteria, UA RARE (A) NONE SEEN   Squamous Epithelial / LPF 0-5 (A) NONE SEEN   Mucus PRESENT   CBC     Status: None   Collection Time: 07/24/17  6:42 PM  Result Value Ref Range   WBC 8.5 4.0 - 10.5 K/uL   RBC 4.11 3.87 - 5.11 MIL/uL   Hemoglobin 12.7 12.0 - 15.0 g/dL   HCT 38.3 36.0 - 46.0 %   MCV 93.2 78.0 - 100.0 fL   MCH 30.9 26.0 - 34.0 pg   MCHC 33.2 30.0 - 36.0 g/dL   RDW 12.9 11.5 - 15.5 %   Platelets 342 150 - 400 K/uL  hCG, quantitative, pregnancy     Status: Abnormal   Collection Time: 07/24/17  6:42 PM  Result Value Ref Range   hCG, Beta Chain, Quant, S 76 (H) <5 mIU/mL   US Ob Less Than 14 Weeks With Ob Transvaginal  Result Date: 07/24/2017 CLINICAL DATA:  Spotting. EXAM: OBSTETRIC <14 WK [place] AND TRANSVAGINAL OB US TECHNIQUE: Both transabdominal and transvaginal ultrasound examinations were performed for complete evaluation of the gestation as well as the maternal uterus, adnexal regions, and pelvic cul-de-sac. Transvaginal technique was performed to assess early pregnancy. COMPARISON:  None. FINDINGS: Intrauterine gestational sac: None Maternal uterus/adnexae: The ovaries are normal in appearance. The endometrium is thickened and heterogeneous measuring 17 mm. Two small fibroids are seen in the uterus with the largest measuring 17 mm. IMPRESSION: 1. No IUP. In the setting of a positive pregnancy test, the lack of an IUP could represent ectopic pregnancy, early pregnancy, or recent miscarriage. Recommend clinical correlation and close follow-up. Electronically Signed   By: Dorise Bullion III M.D   On: 07/24/2017 19:55     MDM CBC, HCG, & ultrasound ordered B positive No active bleeding at this time and cervix closed  HCG 75.  Patient had positive UPT 11 days ago -- expect HCG to be higher. Ultrasound shows no IUP or adnexal mass. Thickened endometrium and small uterine fibroids.   C/w Dr. Sabra Heck. Concerning for miscarriage. Patient has appt with Dr. Talbert Nan on Tuesday. Pt to keep this appointment and they will reassess her labs.  Assessment and Plan  A: 1. Threatened  miscarriage   2. Vaginal bleeding in pregnancy, first trimester    P: Discharge home Pelvic rest Discussed reasons to return to MAU Keep scheduled appt with Dr. Talbert Nan on Tuesday Call office prn   Jorje Guild 07/24/2017, 6:40 PM

## 2017-07-24 NOTE — Discharge Instructions (Signed)
Threatened Miscarriage °A threatened miscarriage occurs when you have vaginal bleeding during your first 20 weeks of pregnancy but the pregnancy has not ended. If you have vaginal bleeding during this time, your health care provider will do tests to make sure you are still pregnant. If the tests show you are still pregnant and the developing baby (fetus) inside your womb (uterus) is still growing, your condition is considered a threatened miscarriage. °A threatened miscarriage does not mean your pregnancy will end, but it does increase the risk of losing your pregnancy (complete miscarriage). °What are the causes? °The cause of a threatened miscarriage is usually not known. If you go on to have a complete miscarriage, the most common cause is an abnormal number of chromosomes in the developing baby. Chromosomes are the structures inside cells that hold all your genetic material. °Some causes of vaginal bleeding that do not result in miscarriage include: °· Having sex. °· Having an infection. °· Normal hormone changes of pregnancy. °· Bleeding that occurs when an egg implants in your uterus. ° °What increases the risk? °Risk factors for bleeding in early pregnancy include: °· Obesity. °· Smoking. °· Drinking excessive amounts of alcohol or caffeine. °· Recreational drug use. ° °What are the signs or symptoms? °· Light vaginal bleeding. °· Mild abdominal pain or cramps. °How is this diagnosed? °If you have bleeding with or without abdominal pain before 20 weeks of pregnancy, your health care provider will do tests to check whether you are still pregnant. One important test involves using sound waves and a computer (ultrasound) to create images of the inside of your uterus. Other tests include an internal exam of your vagina and uterus (pelvic exam) and measurement of your baby’s heart rate. °You may be diagnosed with a threatened miscarriage if: °· Ultrasound testing shows you are still pregnant. °· Your baby’s heart  rate is strong. °· A pelvic exam shows that the opening between your uterus and your vagina (cervix) is closed. °· Your heart rate and blood pressure are stable. °· Blood tests confirm you are still pregnant. ° °How is this treated? °No treatments have been shown to prevent a threatened miscarriage from going on to a complete miscarriage. However, the right home care is important. °Follow these instructions at home: °· Make sure you keep all your appointments for prenatal care. This is very important. °· Get plenty of rest. °· Do not have sex or use tampons if you have vaginal bleeding. °· Do not douche. °· Do not smoke or use recreational drugs. °· Do not drink alcohol. °· Avoid caffeine. °Contact a health care provider if: °· You have light vaginal bleeding or spotting while pregnant. °· You have abdominal pain or cramping. °· You have a fever. °Get help right away if: °· You have heavy vaginal bleeding. °· You have blood clots coming from your vagina. °· You have severe low back pain or abdominal cramps. °· You have fever, chills, and severe abdominal pain. °This information is not intended to replace advice given to you by your health care provider. Make sure you discuss any questions you have with your health care provider. °Document Released: 04/20/2005 Document Revised: 09/26/2015 Document Reviewed: 02/14/2013 °Elsevier Interactive Patient Education © 2018 [employer] ° °

## 2017-07-24 NOTE — Telephone Encounter (Signed)
37 yo [place] Married Hispanic female who is 6 6/7 gestation.  She started having a little bit of brown spotting today.  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  This was several years ago and she had no prior evaluation for recurrent pregnancy loss, although this was recommended with her first visit in our office.  Reports current husband is her second husband.  She's in much better health and this pregnancy has been so different from the others that occurred right after having a positive pregnancy test.  She never had any gynecological care with those pregnancies/miscarriages.  Bleeding is just with wiping.  Has none on a pad.  She is taking a prenatal vitamin.  Has ultrasound appt scheduled on Tuesday, 3/26, scheduled.  Reviewed possible causes of first trimester bleeding and reasons for evaluation at MAU.  She is just resting today and drinking lots of water.  Pelvic rest advised.  She does not feel she needs to go to MAU at this time.  Direct contact information given.  Pt knows if bleeding increases and becomes red or if she starts having pain, should go for evaluation.  MBT B+.

## 2017-07-26 ENCOUNTER — Other Ambulatory Visit: Payer: Self-pay

## 2017-07-26 ENCOUNTER — Telehealth: Payer: Self-pay | Admitting: Obstetrics and Gynecology

## 2017-07-26 ENCOUNTER — Encounter: Payer: Self-pay | Admitting: Obstetrics and Gynecology

## 2017-07-26 ENCOUNTER — Ambulatory Visit: Payer: 59 | Admitting: Obstetrics and Gynecology

## 2017-07-26 VITALS — BP 122/78 | HR 80 | Resp 14 | Wt 154.0 lb

## 2017-07-26 DIAGNOSIS — N96 Recurrent pregnancy loss: Secondary | ICD-10-CM | POA: Diagnosis not present

## 2017-07-26 DIAGNOSIS — O039 Complete or unspecified spontaneous abortion without complication: Secondary | ICD-10-CM | POA: Diagnosis not present

## 2017-07-26 NOTE — Telephone Encounter (Signed)
Please have the patient come in for an appointment at 12:45

## 2017-07-26 NOTE — Telephone Encounter (Signed)
Reviewed with Dr. Talbert Nan, call returned to patient, OV scheduled for today ay 12:45pm with Dr. Talbert Nan. Patient verbalizes understanding.   Routing to provider for final review. Patient is agreeable to disposition. Will close encounter.   Cc: Dr. Sabra Heck

## 2017-07-26 NOTE — Telephone Encounter (Addendum)
-----   Message from Megan Salon, MD sent at 07/25/2017 10:11 PM EDT ----- Regarding: miscarriage Pt was seen in MAU on Saturday with low HCG and bleeding.  Ultrasound was fine.  She has an appt and ultrasound with Dr. Talbert Nan on Tuesday scheduled but may not need this.  Can you please call pt and get update on bleeding.  I think she does need a repeat HCG lever 48 - 72 hours after the Saturday test.  Please see how pt is and then I can decide about follow up appt/ultrasound/HCG testing.  Thanks.

## 2017-07-26 NOTE — Progress Notes (Signed)
GYNECOLOGY  VISIT   HPI: 37 y.o.   Married  Caucasian  female   [phone] with Patient's last menstrual period was 06/06/2017 (exact date).   here c/o cramping and bleeding with pregnancy that started 2 days. She was seen in the ER over the weekend with spotting, she had a BhcG of 76. U/S without IUP.  Bleeding and cramping started yesterday. Heavier overnight. Currently slowing down. Flow has been similar to a normal cycle. Cramps are intermittent, up to a 7/10 in severity. Currently better and flow is light.  She has a h/o recurrent SABs.    LMP of 06/06/17. Just started trying to get pregnant in 1/19. This is her first pregnancy with her husband. Prior SABs   GYNECOLOGIC HISTORY: Patient's last menstrual period was 06/06/2017 (exact date). Contraception:none  Menopausal hormone therapy: none         OB History    Gravida  4   Para      Term      Preterm      AB  3   Living  0     SAB  3   TAB      Ectopic      Multiple      Live Births                 Patient Active Problem List   Diagnosis Date Noted  . Routine general medical examination at a health care facility 05/25/2016  . Occipital neuralgia of right side 10/24/2014  . Multiple allergies 09/07/2014  . Atypical migraine 09/07/2014  . Bruises easily 09/07/2014    Past Medical History:  Diagnosis Date  . Allergy   . Anemia   . Anorexia nervosa with bulimia   . Chicken pox   . Migraines   . Pneumonia   . Rash    Stress induced  . STD (sexually transmitted disease)    HSV1    Past Surgical History:  Procedure Laterality Date  . DILATION AND CURETTAGE OF UTERUS    . NASAL SEPTUM SURGERY    . TUMOR REMOVAL     from face    Current Outpatient Medications  Medication Sig Dispense Refill  . Prenatal Vit-Fe Fumarate-FA (PRENATAL VITAMIN PO) Take by mouth.     No current facility-administered medications for this visit.      ALLERGIES: Patient has no known allergies.  Family History   Problem Relation Age of Onset  . Lung cancer Father   . Diabetes Father   . Heart disease Maternal Grandmother   . Heart attack Maternal Grandmother   . Other Paternal Grandfather        brain tumor  . Colon cancer Paternal Grandmother   . Cancer Sister        uterine     Social History   Socioeconomic History  . Marital status: Married    Spouse name: Not on file  . Number of children: 0  . Years of education: 5  . Highest education level: Not on file  Occupational History  . Occupation: Agricultural consultant at [employer]  . Financial resource strain: Not on file  . Food insecurity:    Worry: Not on file    Inability: Not on file  . Transportation needs:    Medical: Not on file    Non-medical: Not on file  Tobacco Use  . Smoking status: Former Smoker    Years: 3.00  . Smokeless tobacco: Never Used  .  Tobacco comment: over 12 yrs ago  Substance and Sexual Activity  . Alcohol use: No    Frequency: Never  . Drug use: No  . Sexual activity: Yes    Partners: Male    Birth control/protection: None  Lifestyle  . Physical activity:    Days per week: Not on file    Minutes per session: Not on file  . Stress: Not on file  Relationships  . Social connections:    Talks on phone: Not on file    Gets together: Not on file    Attends religious service: Not on file    Active member of club or organization: Not on file    Attends meetings of clubs or organizations: Not on file    Relationship status: Not on file  . Intimate partner violence:    Fear of current or ex partner: Not on file    Emotionally abused: Not on file    Physically abused: Not on file    Forced sexual activity: Not on file  Other Topics Concern  . Not on file  Social History Narrative   Fun: Read, hike and travel.   Denies religious beliefs effecting health care.     Review of Systems  Constitutional: Negative.   HENT: Negative.   Eyes: Negative.   Respiratory: Negative.    Cardiovascular: Negative.   Gastrointestinal: Negative.   Genitourinary:       Bleeding and cramping   Musculoskeletal: Negative.   Skin: Negative.   Neurological: Negative.   Endo/Heme/Allergies: Negative.   Psychiatric/Behavioral: Negative.     PHYSICAL EXAMINATION:    BP 122/78 (BP Location: Right Arm, Patient Position: Sitting, Cuff Size: Normal)   Pulse 80   Resp 14   Wt 154 lb (69.9 kg)   LMP 06/06/2017 (Exact Date)   BMI 25.63 kg/m     General appearance: alert, cooperative and appears stated age Abdomen: soft, non-tender; non distended, no masses,  no organomegaly  Pelvic: External genitalia:  no lesions              Urethra:  normal appearing urethra with no masses, tenderness or lesions              Bartholins and Skenes: normal                 Vagina: normal appearing vagina with normal color and discharge, no lesions              Cervix: passing POC, during the exam. Then gush of blood. Watched bleeding for a short time, slowing down.              Bimanual Exam:  Uterus:  anteverted, mobile, normal sized, mildly tender              Adnexa: no mass, fullness, tenderness                Chaperone was present for exam.  ASSESSMENT SAB, passed apparent POC during the exam H/O Recurrent SAB B+   PLAN Monitor bleeding over the next 20-30 minutes BhcG, TSH, anticardiolipin ab and lupus anticoagulant Discussed a sonohysterogram Discussed possibility of Karyotyping her They would not go to extreme measures to get pregnant    An After Visit Summary was printed and given to the patient.  ~25 minutes face to face time of which over 50% was spent in counseling.   CC: Evalee Mutton, CNM

## 2017-07-26 NOTE — Patient Instructions (Signed)

## 2017-07-26 NOTE — Telephone Encounter (Signed)
Spoke with patient. Reports increased cramping and bleeding on 3/24. Bleeding went from brown d/c to bright red blood. Changing saturated pad " every couple of hours". Cramping is intermittent, goes from 3-7/10. Advised will review with Dr. Sabra Heck and return call to schedule f/u, patient verbalizes understanding.  Dr. Sabra Heck -please review and advise?   Cc: Dr. Talbert Nan

## 2017-07-26 NOTE — Telephone Encounter (Signed)
Patient has a viability ultrasound scheduled for tomorrow and has questions. Patient went to the ER over the weekend for bleeding.

## 2017-07-27 ENCOUNTER — Other Ambulatory Visit: Payer: 59 | Admitting: Obstetrics and Gynecology

## 2017-07-27 ENCOUNTER — Other Ambulatory Visit: Payer: 59

## 2017-07-27 LAB — LUPUS ANTICOAGULANT PANEL
Dilute Viper Venom Time: 33.6 s (ref 0.0–47.0)
PTT LA: 38 s (ref 0.0–51.9)

## 2017-07-27 LAB — CARDIOLIPIN ANTIBODY, IGA

## 2017-07-27 LAB — TSH: TSH: 1.3 u[IU]/mL (ref 0.450–4.500)

## 2017-07-27 LAB — BETA HCG QUANT (REF LAB): HCG QUANT: 38 m[IU]/mL

## 2017-07-29 ENCOUNTER — Telehealth: Payer: Self-pay | Admitting: Obstetrics and Gynecology

## 2017-07-29 DIAGNOSIS — O039 Complete or unspecified spontaneous abortion without complication: Secondary | ICD-10-CM

## 2017-07-29 NOTE — Telephone Encounter (Signed)
Please let the patient know that the pathology specimen didn't have any fetal or placental tissue in it. Check on how she is feeling and schedule her for a f/u Bhcg for next Monday. If she is having problems prior to that she needs to be seen.

## 2017-07-29 NOTE — Telephone Encounter (Signed)
Dr. Melina Copa, pathologist called to report there was not any evidence of chorionic villi in specimen from 07/26/17. Can return call to [phone] if additional information is needed.

## 2017-07-30 ENCOUNTER — Telehealth: Payer: Self-pay

## 2017-07-30 ENCOUNTER — Other Ambulatory Visit (INDEPENDENT_AMBULATORY_CARE_PROVIDER_SITE_OTHER): Payer: 59

## 2017-07-30 ENCOUNTER — Other Ambulatory Visit: Payer: Self-pay | Admitting: *Deleted

## 2017-07-30 DIAGNOSIS — O039 Complete or unspecified spontaneous abortion without complication: Secondary | ICD-10-CM

## 2017-07-30 LAB — BETA HCG QUANT (REF LAB): hCG Quant: 7 m[IU]/mL

## 2017-07-30 NOTE — Telephone Encounter (Signed)
Linda Herring with [facility] called with Stat Quant.BHCG of 7 and she will fax copy to office. Reviewed with Dr.Silva and called patient to notify of value of 7 and not considered negative until 5 or less. Patient voices understanding. Advised patient to keep lab appointment for Monday 08-02-17. Routed to Dr.Silva

## 2017-07-30 NOTE — Telephone Encounter (Signed)
Patient returning call to Kaitlyn. °

## 2017-07-30 NOTE — Telephone Encounter (Signed)
Spoke with patient. Results given. Patient verbalizes understanding. Scheduled for lab work on 08/02/2017 at 11:30 am. Patient is agreeable to date and time. States that she feels fatigued and nauseated. Is having light spotting with mild midline/left cramping. Denies any heavy bleeding or sharp pain. Advised will review with covering MD as Dr.Jertson is out of the office to ensure okay to wait until lab work on 4/1/209.

## 2017-07-30 NOTE — Telephone Encounter (Signed)
Left message to call Kaitlyn at [phone]. 

## 2017-07-30 NOTE — Telephone Encounter (Signed)
I would recommend patient have further evaluation today.  She needs to have a quant hCG.  She may even need a repeat pelvic US depending on the results.  This can be accomplished at [facility]. If she is close to [place], she can come here for the quant hCG now.   Cc- Kaitlyn Sprague

## 2017-07-30 NOTE — Telephone Encounter (Signed)
Call to patient. Advised of instructions from Dr Quincy Simmonds.  Patient will come to office for stat quant now.

## 2017-07-31 NOTE — Telephone Encounter (Signed)
Forwarding to Dr. Jertson. 

## 2017-08-02 ENCOUNTER — Other Ambulatory Visit (INDEPENDENT_AMBULATORY_CARE_PROVIDER_SITE_OTHER): Payer: 59

## 2017-08-02 DIAGNOSIS — O039 Complete or unspecified spontaneous abortion without complication: Secondary | ICD-10-CM

## 2017-08-03 LAB — BETA HCG QUANT (REF LAB): hCG Quant: 2 m[IU]/mL

## 2017-08-19 DIAGNOSIS — T310 Burns involving less than 10% of body surface: Secondary | ICD-10-CM | POA: Diagnosis not present

## 2017-08-19 DIAGNOSIS — T2121XA Burn of second degree of chest wall, initial encounter: Secondary | ICD-10-CM | POA: Diagnosis not present

## 2017-08-19 DIAGNOSIS — L089 Local infection of the skin and subcutaneous tissue, unspecified: Secondary | ICD-10-CM | POA: Diagnosis not present

## 2017-09-12 ENCOUNTER — Encounter: Payer: Self-pay | Admitting: Certified Nurse Midwife

## 2017-09-13 ENCOUNTER — Telehealth: Payer: Self-pay | Admitting: Certified Nurse Midwife

## 2017-09-13 MED ORDER — VALACYCLOVIR HCL 500 MG PO TABS: ORAL_TABLET | ORAL | 1 refills | Status: AC

## 2017-09-13 NOTE — Telephone Encounter (Signed)
-----   Message from [place], Generic sent at 09/12/2017 7:50 AM EDT -----    Good morning Debbie,  I have a breakout and I need a Valacyclovir prescription. Do I need to make an appointment?

## 2017-09-13 NOTE — Telephone Encounter (Signed)
Patient called requesting a prescription for Valacyclovir to be sent to the pharmacy on file. She said she has gotten it from our office before and usually only needs it once a year or so.

## 2017-09-13 NOTE — Telephone Encounter (Signed)
Spoke with patient. Advised rx has been sent to pharmacy on file. Patient is agreeable. Encounter closed.

## 2017-09-13 NOTE — Telephone Encounter (Signed)
Will send in a script for her. Please inform.

## 2017-09-13 NOTE — Telephone Encounter (Signed)
Patient has a history of HSV 1. Has been prescribed Valtrex 500 mg take 1 tablet by mouth bid daily for flare as directed by Kem Boroughs, FNP. Last rx written in 2017. Last aex 04/23/17 with Melvia Heaps CNM.  Routing to [place] for review and advise as Melvia Heaps CNM is out of the office today and Dr.Jertson has seen this patient as well.

## 2017-10-19 DIAGNOSIS — N96 Recurrent pregnancy loss: Secondary | ICD-10-CM | POA: Diagnosis not present

## 2017-10-25 DIAGNOSIS — N96 Recurrent pregnancy loss: Secondary | ICD-10-CM | POA: Diagnosis not present

## 2017-11-24 ENCOUNTER — Encounter: Payer: Self-pay | Admitting: Medical

## 2017-11-24 ENCOUNTER — Ambulatory Visit: Payer: 59 | Admitting: Medical

## 2017-11-24 VITALS — BP 120/62 | HR 73 | Temp 99.0°F | Resp 16 | Ht 66.0 in | Wt 158.4 lb

## 2017-11-24 DIAGNOSIS — Z0000 Encounter for general adult medical examination without abnormal findings: Secondary | ICD-10-CM | POA: Diagnosis not present

## 2017-11-24 NOTE — Patient Instructions (Addendum)
For you wellness exam today I have ordered cbc, cmp, lipid panel, and ua future labs  Vaccine given today none. Appear up to date.  Recommend exercise and healthy diet.  We will let you know lab results as they come in.  Follow up date appointment will be determined after lab review.     Preventive Care 18-39 Years, Female Preventive care refers to lifestyle choices and visits with your health care provider that can promote health and wellness. What does preventive care include?  A yearly physical exam. This is also called an annual well check.  Dental exams once or twice a year.  Routine eye exams. Ask your health care provider how often you should have your eyes checked.  Personal lifestyle choices, including: ? Daily care of your teeth and gums. ? Regular physical activity. ? Eating a healthy diet. ? Avoiding tobacco and drug use. ? Limiting alcohol use. ? Practicing safe sex. ? Taking vitamin and mineral supplements as recommended by your health care provider. What happens during an annual well check? The services and screenings done by your health care provider during your annual well check will depend on your age, overall health, lifestyle risk factors, and family history of disease. Counseling Your health care provider may ask you questions about your:  Alcohol use.  Tobacco use.  Drug use.  Emotional well-being.  Home and relationship well-being.  Sexual activity.  Eating habits.  Work and work Statistician.  Method of birth control.  Menstrual cycle.  Pregnancy history.  Screening You may have the following tests or measurements:  Height, weight, and BMI.  Diabetes screening. This is done by checking your blood sugar (glucose) after you have not eaten for a while (fasting).  Blood pressure.  Lipid and cholesterol levels. These may be checked every 5 years starting at age 60.  Skin check.  Hepatitis C blood test.  Hepatitis B blood  test.  Sexually transmitted disease (STD) testing.  BRCA-related cancer screening. This may be done if you have a family history of breast, ovarian, tubal, or peritoneal cancers.  Pelvic exam and Pap test. This may be done every 3 years starting at age 75. Starting at age 60, this may be done every 5 years if you have a Pap test in combination with an HPV test.  Discuss your test results, treatment options, and if necessary, the need for more tests with your health care provider. Vaccines Your health care provider may recommend certain vaccines, such as:  Influenza vaccine. This is recommended every year.  Tetanus, diphtheria, and acellular pertussis (Tdap, Td) vaccine. You may need a Td booster every 10 years.  Varicella vaccine. You may need this if you have not been vaccinated.  HPV vaccine. If you are 77 or younger, you may need three doses over 6 months.  Measles, mumps, and rubella (MMR) vaccine. You may need at least one dose of MMR. You may also need a second dose.  Pneumococcal 13-valent conjugate (PCV13) vaccine. You may need this if you have certain conditions and were not previously vaccinated.  Pneumococcal polysaccharide (PPSV23) vaccine. You may need one or two doses if you smoke cigarettes or if you have certain conditions.  Meningococcal vaccine. One dose is recommended if you are age 60-21 years and a first-year college student living in a residence hall, or if you have one of several medical conditions. You may also need additional booster doses.  Hepatitis A vaccine. You may need this if you have certain  conditions or if you travel or work in places where you may be exposed to hepatitis A.  Hepatitis B vaccine. You may need this if you have certain conditions or if you travel or work in places where you may be exposed to hepatitis B.  Haemophilus influenzae type b (Hib) vaccine. You may need this if you have certain risk factors.  Talk to your health care  provider about which screenings and vaccines you need and how often you need them. This information is not intended to replace advice given to you by your health care provider. Make sure you discuss any questions you have with your health care provider. Document Released: 06/16/2001 Document Revised: 01/08/2016 Document Reviewed: 02/19/2015 Elsevier Interactive Patient Education  [employer].

## 2017-11-24 NOTE — Progress Notes (Signed)
Subjective:    Patient ID: Linda Herring, female    DOB: [birth year], 37 y.o.   MRN: [id number]  HPI  Pt in for first time.  She states she feels fine. She state lost pcp.   Pt works [employer], She exercises 3-4 times a week. Pt trying to cut back on alcohol. Pt is trying to get pregnant. She has had 4 miscarriage. Married- no children.(seeing fertility specialist(Dr Rivard)  LMP- October 25, 2017     Review of Systems  Constitutional: Negative for chills, fatigue and fever.  HENT: Negative for congestion and drooling.        Seasonal allergies but not presently.   Respiratory: Negative for cough, chest tightness, shortness of breath and wheezing.   Cardiovascular: Negative for chest pain and palpitations.  Gastrointestinal: Negative for abdominal pain.  Musculoskeletal:       Hx of back pain but not present.  Neurological: Negative for dizziness, numbness and headaches.       Hx of migraine but none recently.    Past Medical History:  Diagnosis Date  . Allergy   . Anemia   . Anorexia nervosa with bulimia   . Chicken pox   . Migraines   . Pneumonia   . Rash    Stress induced  . STD (sexually transmitted disease)    HSV1     Social History   Socioeconomic History  . Marital status: Married    Spouse name: Not on file  . Number of children: 0  . Years of education: 58  . Highest education level: Not on file  Occupational History  . Occupation: Agricultural consultant at [employer]  . Financial resource strain: Not on file  . Food insecurity:    Worry: Not on file    Inability: Not on file  . Transportation needs:    Medical: Not on file    Non-medical: Not on file  Tobacco Use  . Smoking status: Former Smoker    Years: 3.00  . Smokeless tobacco: Never Used  . Tobacco comment: over 12 yrs ago  Substance and Sexual Activity  . Alcohol use: No    Frequency: Never  . Drug use: No  . Sexual activity: Yes    Partners: Male    Birth control/protection: None    Lifestyle  . Physical activity:    Days per week: Not on file    Minutes per session: Not on file  . Stress: Not on file  Relationships  . Social connections:    Talks on phone: Not on file    Gets together: Not on file    Attends religious service: Not on file    Active member of club or organization: Not on file    Attends meetings of clubs or organizations: Not on file    Relationship status: Not on file  . Intimate partner violence:    Fear of current or ex partner: Not on file    Emotionally abused: Not on file    Physically abused: Not on file    Forced sexual activity: Not on file  Other Topics Concern  . Not on file  Social History Narrative   Fun: Read, hike and travel.   Denies religious beliefs effecting health care.     Past Surgical History:  Procedure Laterality Date  . DILATION AND CURETTAGE OF UTERUS    . NASAL SEPTUM SURGERY    . TUMOR REMOVAL     from face  Family History  Problem Relation Age of Onset  . Lung cancer Father   . Diabetes Father   . Heart disease Maternal Grandmother   . Heart attack Maternal Grandmother   . Other Paternal Grandfather        brain tumor  . Colon cancer Paternal Grandmother   . Cancer Sister        uterine     No Known Allergies  Current Outpatient Medications on File Prior to Visit  Medication Sig Dispense Refill  . valACYclovir (VALTREX) 500 MG tablet 1 tablet BID x 3 days as needed 30 tablet 1  . Prenatal Vit-Fe Fumarate-FA (PRENATAL VITAMIN PO) Take by mouth.     No current facility-administered medications on file prior to visit.     BP 120/62   Pulse 73   Temp 99 F (37.2 C) (Oral)   Resp 16   Ht 5\' 6"  (1.676 m)   Wt 158 lb 6.4 oz (71.8 kg)   LMP 06/06/2017 (Exact Date)   SpO2 99%   BMI 25.57 kg/m       Objective:   Physical Exam  General Mental Status- Alert. General Appearance- Not in acute distress.    Neck Carotid Arteries- Normal color. Moisture- Normal Moisture. No carotid  bruits. No JVD.  Chest and Lung Exam Auscultation: Breath Sounds:-Normal.  Cardiovascular Auscultation:Rythm- Regular. Murmurs & Other Heart Sounds:Auscultation of the heart reveals- No Murmurs.  Abdomen Inspection:-Inspeection Normal. Palpation/Percussion:Note:No mass. Palpation and Percussion of the abdomen reveal- Non Tender, Non Distended + BS, no rebound or guarding.  Neurologic Cranial Nerve exam:- CN III-XII intact(No nystagmus), symmetric smile. Strength:- 5/5 equal and symmetric strength both upper and lower extremities.      Assessment & Plan:  For you wellness exam today I have ordered cbc, cmp, lipid panel, and ua future labs  Vaccine given today none. Appear up to date.  Recommend exercise and healthy diet.  We will let you know lab results as they come in.  Follow up date appointment will be determined after lab review.    Mackie Pai, PA-C

## 2017-11-29 ENCOUNTER — Other Ambulatory Visit (INDEPENDENT_AMBULATORY_CARE_PROVIDER_SITE_OTHER): Payer: 59

## 2017-11-29 ENCOUNTER — Telehealth: Payer: Self-pay | Admitting: Medical

## 2017-11-29 DIAGNOSIS — R81 Glycosuria: Secondary | ICD-10-CM

## 2017-11-29 DIAGNOSIS — Z0000 Encounter for general adult medical examination without abnormal findings: Secondary | ICD-10-CM

## 2017-11-29 LAB — CBC WITH DIFFERENTIAL/PLATELET
BASOS ABS: 0 10*3/uL (ref 0.0–0.1)
Basophils Relative: 0.4 % (ref 0.0–3.0)
Eosinophils Absolute: 0.1 10*3/uL (ref 0.0–0.7)
Eosinophils Relative: 1.3 % (ref 0.0–5.0)
HCT: 37.3 % (ref 36.0–46.0)
HEMOGLOBIN: 12.8 g/dL (ref 12.0–15.0)
LYMPHS ABS: 1.6 10*3/uL (ref 0.7–4.0)
Lymphocytes Relative: 20.9 % (ref 12.0–46.0)
MCHC: 34.2 g/dL (ref 30.0–36.0)
MCV: 94.6 fl (ref 78.0–100.0)
MONOS PCT: 5.7 % (ref 3.0–12.0)
Monocytes Absolute: 0.4 10*3/uL (ref 0.1–1.0)
NEUTROS PCT: 71.7 % (ref 43.0–77.0)
Neutro Abs: 5.6 10*3/uL (ref 1.4–7.7)
Platelets: 373 10*3/uL (ref 150.0–400.0)
RBC: 3.94 Mil/uL (ref 3.87–5.11)
RDW: 12.8 % (ref 11.5–15.5)
WBC: 7.8 10*3/uL (ref 4.0–10.5)

## 2017-11-29 LAB — URINALYSIS, ROUTINE W REFLEX MICROSCOPIC
BILIRUBIN URINE: NEGATIVE
Ketones, ur: NEGATIVE
LEUKOCYTES UA: NEGATIVE
NITRITE: NEGATIVE
Specific Gravity, Urine: 1.02 (ref 1.000–1.030)
TOTAL PROTEIN, URINE-UPE24: NEGATIVE
Urine Glucose: NEGATIVE
Urobilinogen, UA: 0.2 (ref 0.0–1.0)
pH: 6.5 (ref 5.0–8.0)

## 2017-11-29 LAB — COMPREHENSIVE METABOLIC PANEL
ALBUMIN: 3.9 g/dL (ref 3.5–5.2)
ALK PHOS: 50 U/L (ref 39–117)
ALT: 12 U/L (ref 0–35)
AST: 15 U/L (ref 0–37)
BILIRUBIN TOTAL: 0.4 mg/dL (ref 0.2–1.2)
BUN: 18 mg/dL (ref 6–23)
CO2: 26 mEq/L (ref 19–32)
Calcium: 8.8 mg/dL (ref 8.4–10.5)
Chloride: 106 mEq/L (ref 96–112)
Creatinine, Ser: 0.8 mg/dL (ref 0.40–1.20)
GFR: 85.06 mL/min (ref >60.00)
GLUCOSE: 93 mg/dL (ref 70–99)
Potassium: 4.4 mEq/L (ref 3.5–5.1)
Sodium: 141 mEq/L (ref 135–145)
TOTAL PROTEIN: 6.4 g/dL (ref 6.0–8.3)

## 2017-11-29 LAB — LIPID PANEL
CHOLESTEROL: 187 mg/dL (ref 0–200)
HDL: 63.1 mg/dL (ref >39.00)
LDL Cholesterol: 104 mg/dL — ABNORMAL HIGH (ref 0–99)
NONHDL: 123.88
TRIGLYCERIDES: 101 mg/dL (ref 0.0–149.0)
Total CHOL/HDL Ratio: 3
VLDL: 20.2 mg/dL (ref 0.0–40.0)

## 2017-11-29 NOTE — Telephone Encounter (Signed)
Sent message to [facility] to help place order to repeat urine in lab.

## 2017-11-29 NOTE — Telephone Encounter (Signed)
I forgot the lab order for urine to be done in lab. Will you put order in an help pt to get scheduled to give repeat urine.

## 2017-11-30 NOTE — Telephone Encounter (Signed)
Left pt a message to call back and schedule appointment. Lab order placed.

## 2017-11-30 NOTE — Addendum Note (Signed)
Addended by: Hinton Dyer on: 11/30/2017 09:22 AM   Modules accepted: Orders

## 2017-12-01 DIAGNOSIS — N96 Recurrent pregnancy loss: Secondary | ICD-10-CM | POA: Diagnosis not present

## 2017-12-02 ENCOUNTER — Other Ambulatory Visit: Payer: 59

## 2017-12-03 ENCOUNTER — Other Ambulatory Visit (INDEPENDENT_AMBULATORY_CARE_PROVIDER_SITE_OTHER): Payer: 59

## 2017-12-03 DIAGNOSIS — R81 Glycosuria: Secondary | ICD-10-CM | POA: Diagnosis not present

## 2017-12-03 LAB — POC URINALSYSI DIPSTICK (AUTOMATED)
Bilirubin, UA: NEGATIVE
Glucose, UA: NEGATIVE
Ketones, UA: NEGATIVE
Leukocytes, UA: NEGATIVE
NITRITE UA: NEGATIVE
PROTEIN UA: NEGATIVE
RBC UA: NEGATIVE
SPEC GRAV UA: 1.015 (ref 1.010–1.025)
Urobilinogen, UA: 0.2 E.U./dL
pH, UA: 6.5 (ref 5.0–8.0)

## 2017-12-07 DIAGNOSIS — J029 Acute pharyngitis, unspecified: Secondary | ICD-10-CM | POA: Diagnosis not present

## 2017-12-07 DIAGNOSIS — H66003 Acute suppurative otitis media without spontaneous rupture of ear drum, bilateral: Secondary | ICD-10-CM | POA: Diagnosis not present

## 2017-12-07 DIAGNOSIS — J019 Acute sinusitis, unspecified: Secondary | ICD-10-CM | POA: Diagnosis not present

## 2017-12-15 DIAGNOSIS — N96 Recurrent pregnancy loss: Secondary | ICD-10-CM | POA: Diagnosis not present

## 2018-01-06 DIAGNOSIS — N96 Recurrent pregnancy loss: Secondary | ICD-10-CM | POA: Diagnosis not present

## 2018-01-20 DIAGNOSIS — Z23 Encounter for immunization: Secondary | ICD-10-CM | POA: Diagnosis not present

## 2018-03-14 DIAGNOSIS — N96 Recurrent pregnancy loss: Secondary | ICD-10-CM | POA: Diagnosis not present

## 2018-03-14 DIAGNOSIS — D250 Submucous leiomyoma of uterus: Secondary | ICD-10-CM | POA: Diagnosis not present

## 2018-04-20 DIAGNOSIS — N978 Female infertility of other origin: Secondary | ICD-10-CM | POA: Diagnosis not present

## 2018-04-20 DIAGNOSIS — N979 Female infertility, unspecified: Secondary | ICD-10-CM | POA: Diagnosis not present

## 2018-04-21 ENCOUNTER — Other Ambulatory Visit: Payer: Self-pay | Admitting: Obstetrics and Gynecology

## 2018-04-21 DIAGNOSIS — N979 Female infertility, unspecified: Secondary | ICD-10-CM

## 2018-05-05 ENCOUNTER — Ambulatory Visit
Admission: RE | Admit: 2018-05-05 | Discharge: 2018-05-05 | Disposition: A | Discharge status: Home / Self Care | Payer: 59 | Source: Ambulatory Visit | Attending: Obstetrics and Gynecology | Admitting: Obstetrics and Gynecology

## 2018-05-05 ENCOUNTER — Encounter: Payer: Self-pay | Admitting: Radiology

## 2018-05-05 DIAGNOSIS — N979 Female infertility, unspecified: Secondary | ICD-10-CM

## 2018-05-05 MED ORDER — GADOBENATE DIMEGLUMINE 529 MG/ML IV SOLN
15.0000 mL | Freq: Once | INTRAVENOUS | Status: AC | PRN
  Administered 2018-05-05: 15 mL via INTRAVENOUS

## 2018-06-01 DIAGNOSIS — N979 Female infertility, unspecified: Secondary | ICD-10-CM | POA: Diagnosis not present

## 2018-06-01 DIAGNOSIS — N840 Polyp of corpus uteri: Secondary | ICD-10-CM | POA: Diagnosis not present

## 2018-06-01 DIAGNOSIS — D250 Submucous leiomyoma of uterus: Secondary | ICD-10-CM | POA: Diagnosis not present

## 2018-06-01 DIAGNOSIS — Z3141 Encounter for fertility testing: Secondary | ICD-10-CM | POA: Diagnosis not present

## 2018-06-06 DIAGNOSIS — Z3141 Encounter for fertility testing: Secondary | ICD-10-CM | POA: Diagnosis not present

## 2018-06-08 DIAGNOSIS — L723 Sebaceous cyst: Secondary | ICD-10-CM | POA: Diagnosis not present

## 2018-07-01 DIAGNOSIS — Z3141 Encounter for fertility testing: Secondary | ICD-10-CM | POA: Diagnosis not present

## 2018-07-04 DIAGNOSIS — Z3141 Encounter for fertility testing: Secondary | ICD-10-CM | POA: Diagnosis not present

## 2018-07-22 DIAGNOSIS — Z3200 Encounter for pregnancy test, result unknown: Secondary | ICD-10-CM | POA: Diagnosis not present

## 2018-07-26 DIAGNOSIS — Z3200 Encounter for pregnancy test, result unknown: Secondary | ICD-10-CM | POA: Diagnosis not present

## 2018-08-08 DIAGNOSIS — Z3A00 Weeks of gestation of pregnancy not specified: Secondary | ICD-10-CM | POA: Diagnosis not present

## 2018-08-08 DIAGNOSIS — O200 Threatened abortion: Secondary | ICD-10-CM | POA: Diagnosis not present

## 2018-11-03 DIAGNOSIS — Z3040 Encounter for surveillance of contraceptives, unspecified: Secondary | ICD-10-CM | POA: Diagnosis not present

## 2018-11-03 DIAGNOSIS — Z6826 Body mass index (BMI) 26.0-26.9, adult: Secondary | ICD-10-CM | POA: Diagnosis not present

## 2018-11-03 DIAGNOSIS — N96 Recurrent pregnancy loss: Secondary | ICD-10-CM | POA: Diagnosis not present

## 2018-11-03 DIAGNOSIS — Z01419 Encounter for gynecological examination (general) (routine) without abnormal findings: Secondary | ICD-10-CM | POA: Diagnosis not present

## 2018-12-13 DIAGNOSIS — N750 Cyst of Bartholin's gland: Secondary | ICD-10-CM | POA: Diagnosis not present

## 2019-01-04 DIAGNOSIS — Z20828 Contact with and (suspected) exposure to other viral communicable diseases: Secondary | ICD-10-CM | POA: Diagnosis not present

## 2019-01-30 DIAGNOSIS — L03011 Cellulitis of right finger: Secondary | ICD-10-CM | POA: Diagnosis not present

## 2019-03-28 IMAGING — MR MR PELVIS WO/W CM
10 of 11 series · 40 of 48 positions shown · IV contrast (multihance)
Comparison: None.

CLINICAL DATA: Female infertility with 4 miscarriages. Possible
bicornuate uterus. Uterine fibroids.

EXAM:
MRI PELVIS WITHOUT AND WITH CONTRAST
TECHNIQUE: Multiplanar multisequence MR imaging of the pelvis was performed
both before and after administration of intravenous contrast.
CONTRAST:  15mL MULTIHANCE GADOBENATE DIMEGLUMINE 529 MG/ML IV SOLN

[Series 3: cor haste · coronal · 6.0mm · 0.78mm/px · 3 of 21 slices shown]
[im 1/21]
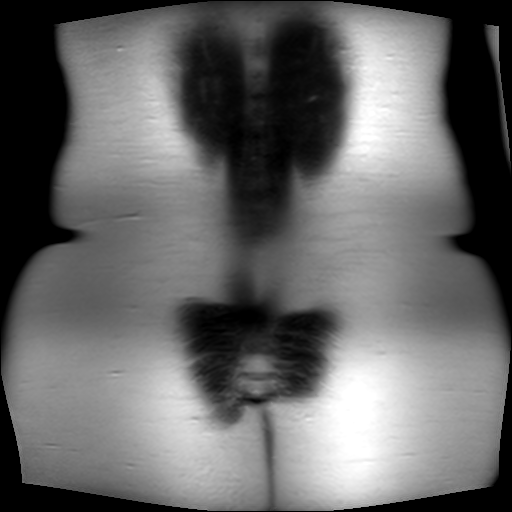
[im 11/21]
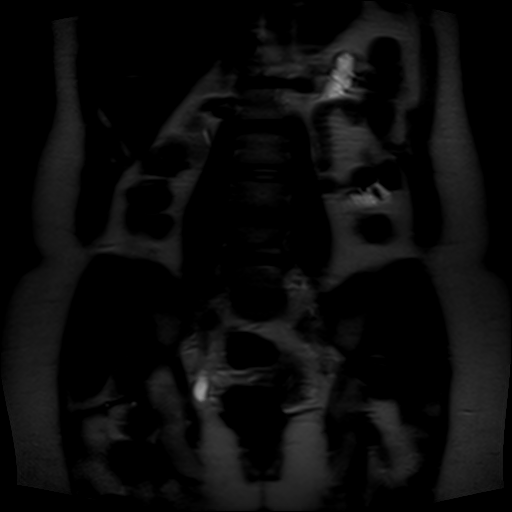
[im 21/21]
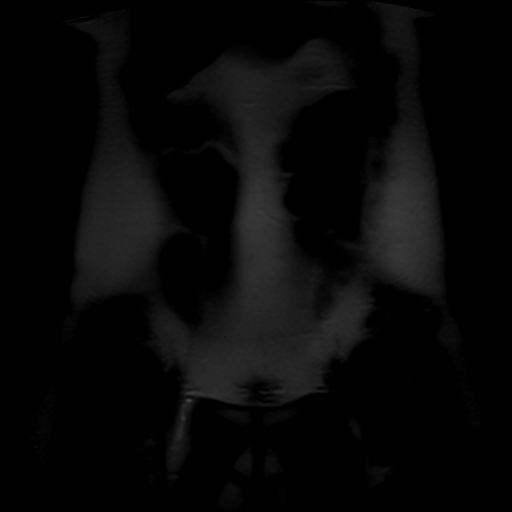

[Series 4: t2_tse_sag · sagittal · 5.0mm · 0.94mm/px · 3 of 21 slices shown (1 of 2)]
[im 1/21]
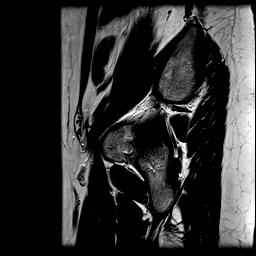
[im 11/21]
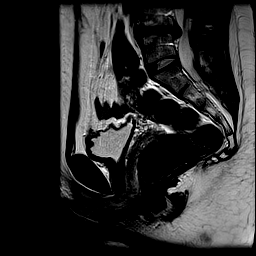
[im 21/21]
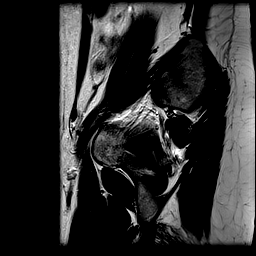

[Series 5: t2_tse axial · axial · 5.0mm · 0.94mm/px · z∈[-189,-15]mm · 6 of 30 slices shown]
[im 1/30]
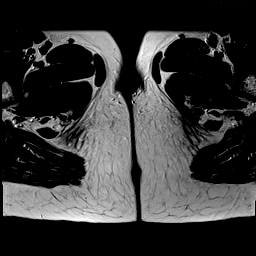
[im 6/30]
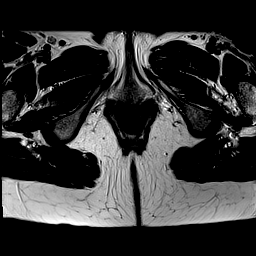
[im 12/30]
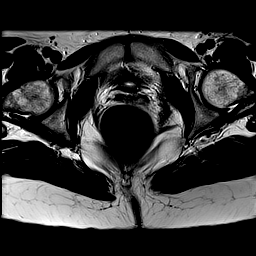
[im 18/30]
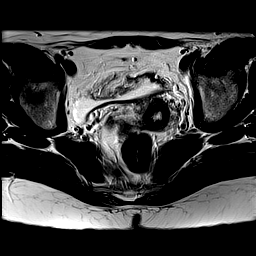
[im 24/30]
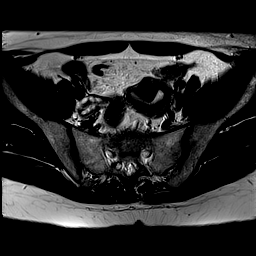
[im 30/30]
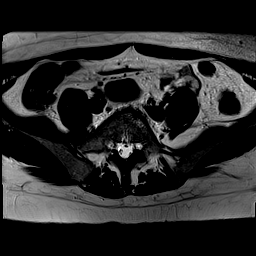

[Series 6: t2_tse axial fs · axial · 5.0mm · 0.94mm/px · z∈[-162,-42]mm · 4 of 21 slices shown]
[im 1/21]
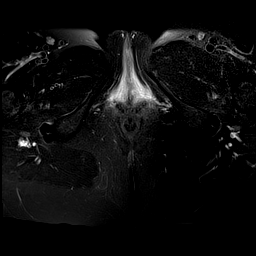
[im 7/21]
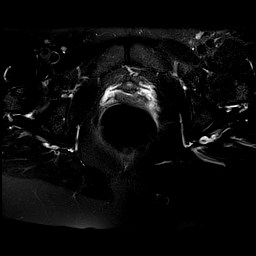
[im 14/21]
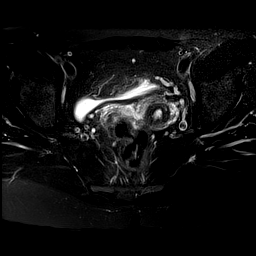
[im 21/21]
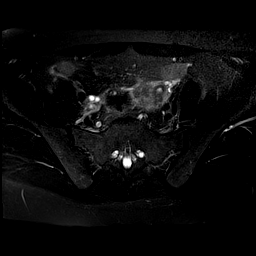

[Series 7: axial spgr · axial · 5.0mm · 0.94mm/px · z∈[-162,-42]mm · 4 of 21 slices shown]
[im 1/21]
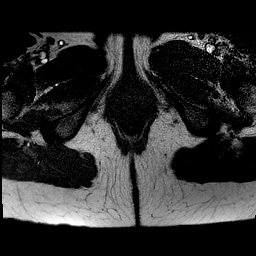
[im 7/21]
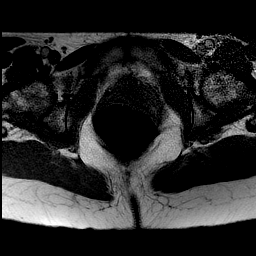
[im 14/21]
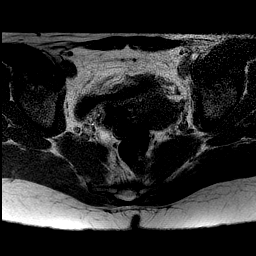
[im 21/21]
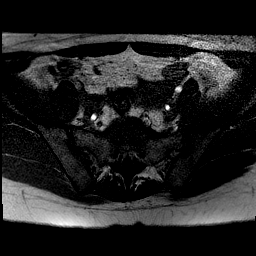

[Series 8: axial spgr pre · axial · non-contrast · 5.0mm · 0.94mm/px · z∈[-162,-42]mm · 4 of 21 slices shown]
[im 1/21]
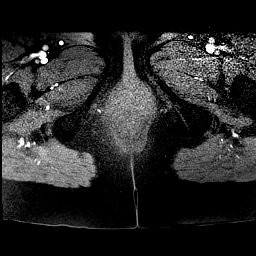
[im 7/21]
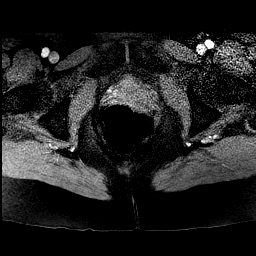
[im 14/21]
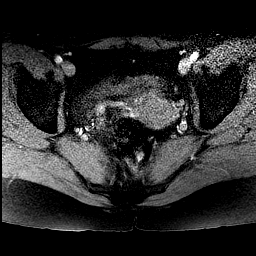
[im 21/21]
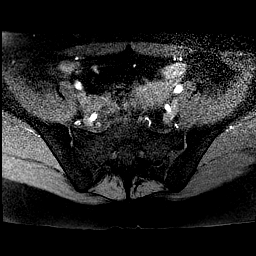

[Series 9: t2_tse cor oblique · axial · 4.0mm · 0.47mm/px · z∈[-170,-62]mm · 5 of 25 slices shown]
[im 1/25]
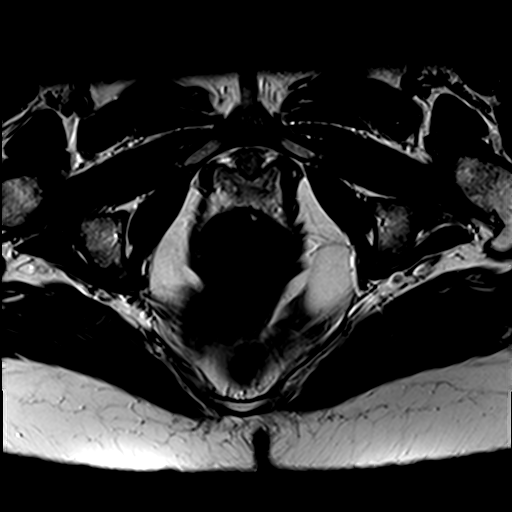
[im 7/25]
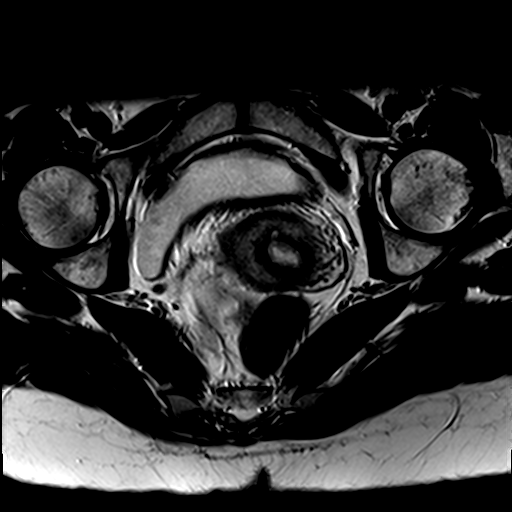
[im 13/25]
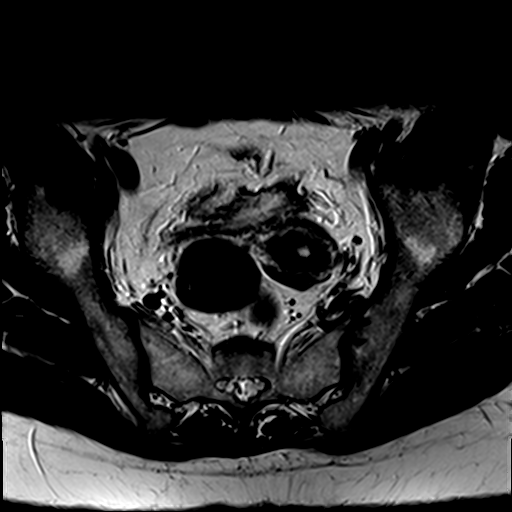
[im 19/25]
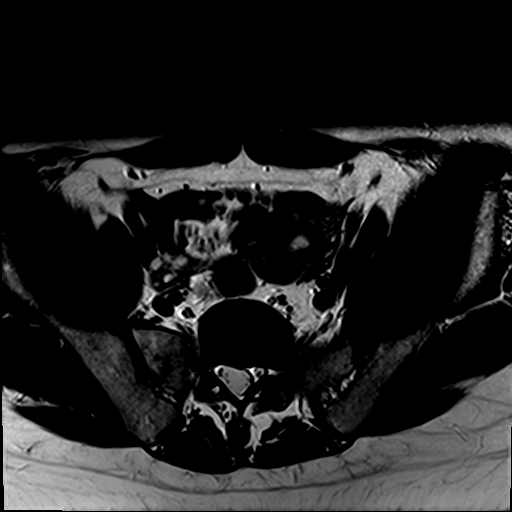
[im 25/25]
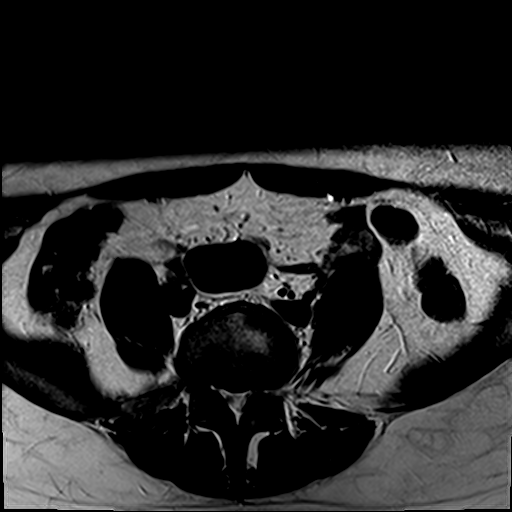

[Series 10: t2_tse_sag · sagittal · 5.0mm · 0.94mm/px · 5 of 25 slices shown (2 of 2)]
[im 1/25]
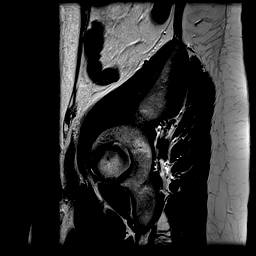
[im 7/25]
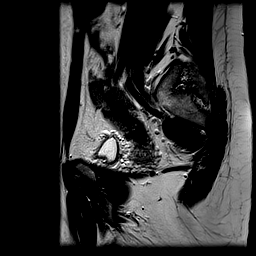
[im 13/25]
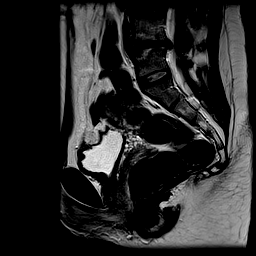
[im 19/25]
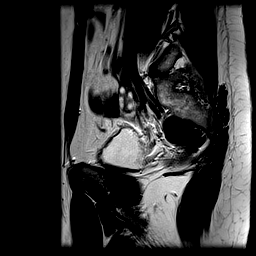
[im 25/25]
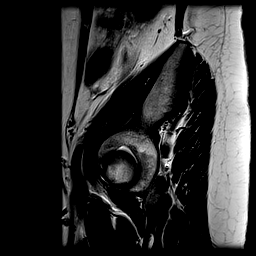

[Series 12: sag spgr post · sagittal · 5.0mm · 0.62mm/px · 4 of 20 slices shown]
[im 1/20]
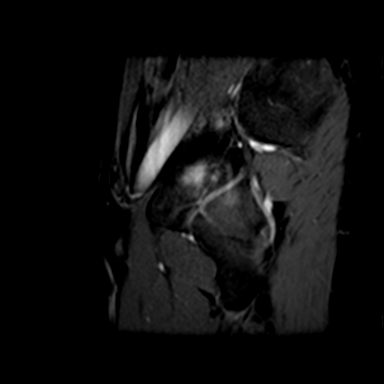
[im 7/20]
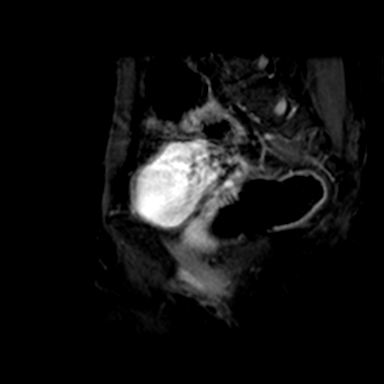
[im 13/20]
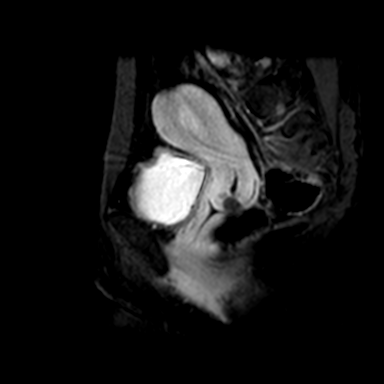
[im 20/20]
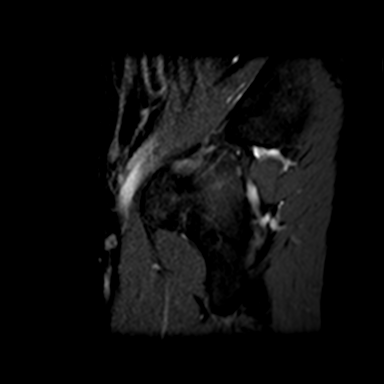

[Series 14: axial spgr post · axial · 6.0mm · 0.52mm/px · z∈[-182,-146]mm · 2 of 30 slices shown]
[im 1/30]
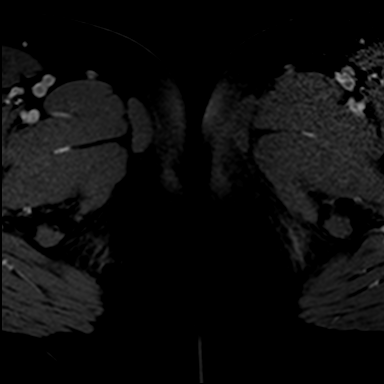
[im 6/30]
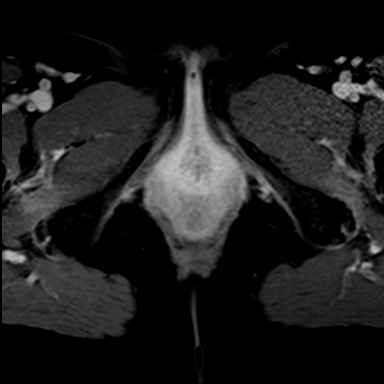

[40 of 48 positions shown; findings below may reference images not displayed]

FINDINGS: Urinary Tract:  Bladder is within normal limits.

Bowel:  Visualized bowel is unremarkable.

Vascular/Lymphatic: No evidence of aneurysm.

No suspicious pelvic lymphadenopathy.

Reproductive: Partial bicornuate versus subseptate uterus. An almost
normal outer fundal contour is present, although perhaps exacerbated
by anterior fundal fibroid (series 10/image 15). Regardless, this
favors the diagnosis of subseptate uterus. Difficult to discretely
measure, the length of the septum is approximately 1.2 cm.

Left ovary is within normal limits, noting a 2.1 cm cyst/follicle
(series 3/image 18).

Right ovary is within normal limits (series 6/image 3).

Other:  No pelvic ascites.

Musculoskeletal: No focal osseous lesions.
IMPRESSION: Suspected subseptate uterus, less likely partial bicornuate uterus,
as described above.

Bilateral ovaries are within normal limits.

## 2019-03-28 IMAGING — MR MR ABDOMEN WO/W CM
17 series · 48 of 48 positions shown · IV contrast (multihance)
Comparison: None

Addendum:
CLINICAL DATA: Evaluate for renal anomaly

EXAM:
MRI ABDOMEN WITHOUT AND WITH CONTRAST
TECHNIQUE: Multiplanar multisequence MR imaging of the abdomen was performed
both before and after the administration of intravenous contrast.
CONTRAST:  13mL MULTIHANCE GADOBENATE DIMEGLUMINE 529 MG/ML IV SOLN

[Series 3: T2 · coronal · 5.0mm · 0.78mm/px · 1 of 20 slices shown (1 of 3)]
[im 1/20]
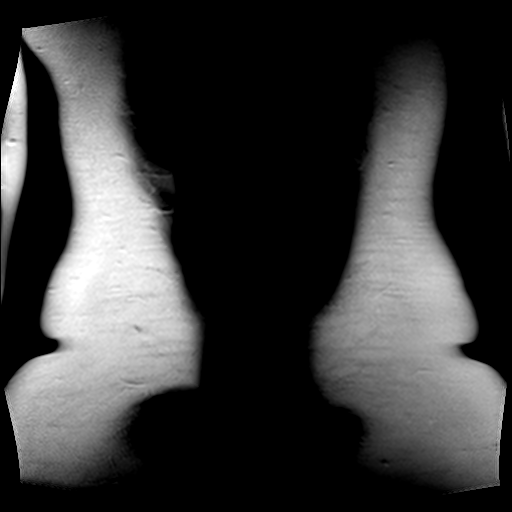

[Series 4: T2 · axial · 5.0mm · 0.66mm/px · 1 of 27 slices shown (2 of 3)]
[im 1/27]
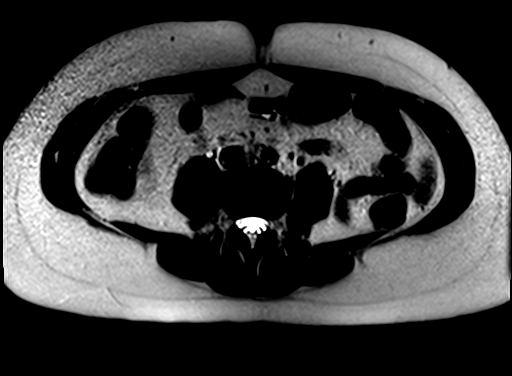

[Series 5: bSSFP · axial · 4.0mm · 0.74mm/px · 1 of 40 slices shown]
[im 1/40]
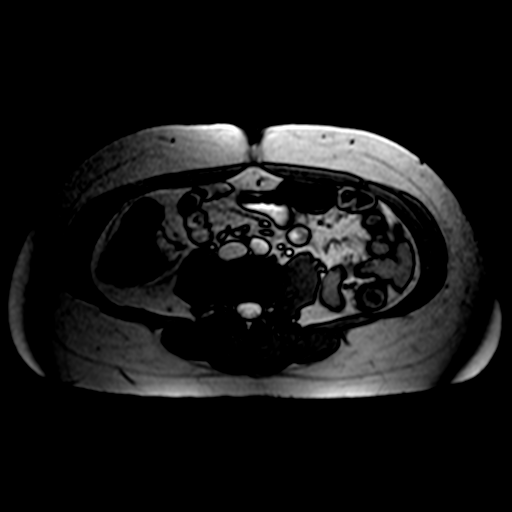

[Series 6: T1 · axial · 5.0mm · 0.66mm/px · z∈[-21,+129]mm · 2 of 52 slices shown]
[im 1/52]
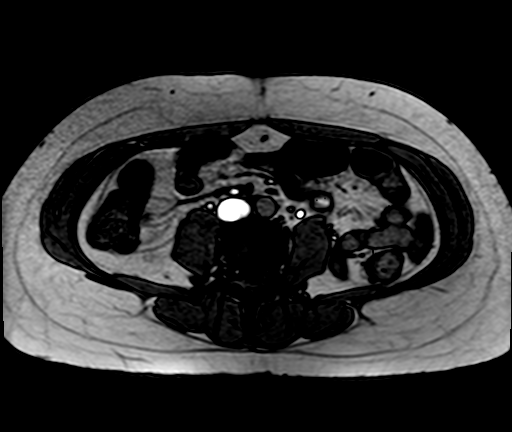
[im 52/52]
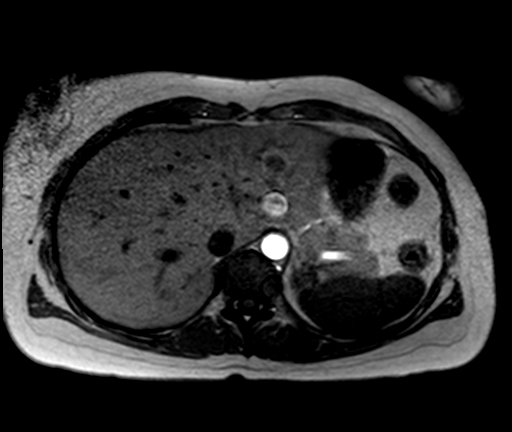

[Series 7: ep2d_diff_b50_500_800_p2_trig · axial · 6.0mm · 1.98mm/px · z∈[-17,+163]mm · 3 of 72 slices shown]
[im 1/72]
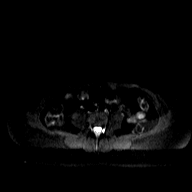
[im 36/72]
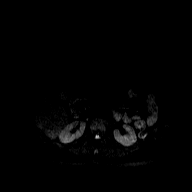
[im 72/72]
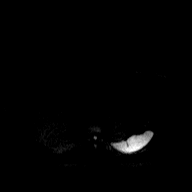

[Series 8: ep2d_diff_b50_500_800_p2_trig_adc · axial · 6.0mm · 1.98mm/px · 1 of 24 slices shown]
[im 1/24]
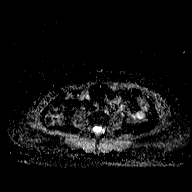

[Series 9: T2 · axial · 5.0mm · 1.33mm/px · 1 of 30 slices shown (3 of 3)]
[im 1/30]
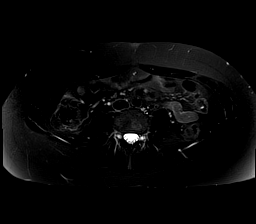

[Series 10: T1 dynamic · axial · non-contrast · 2.3mm · 1.41mm/px · z∈[-30,+133]mm · 3 of 72 slices shown]
[im 1/72]
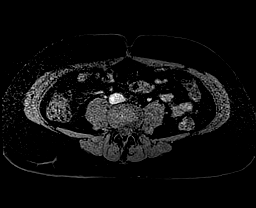
[im 36/72]
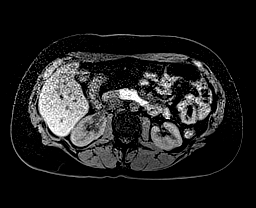
[im 72/72]
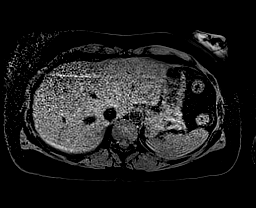

[Series 11: post 25 sec · axial · 2.3mm · 1.41mm/px · z∈[-30,+133]mm · 4 of 72 slices shown]
[im 1/72]
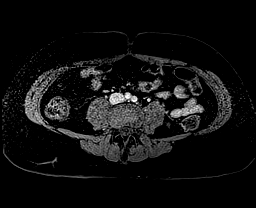
[im 24/72]
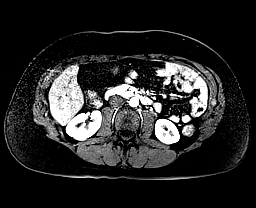
[im 48/72]
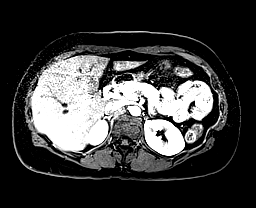
[im 72/72]
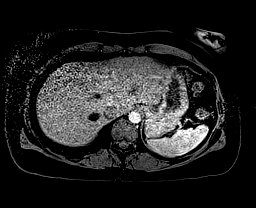

[Series 12: post 25 sec_sub · axial · 2.3mm · 1.41mm/px · z∈[-30,+133]mm · 4 of 72 slices shown]
[im 1/72]
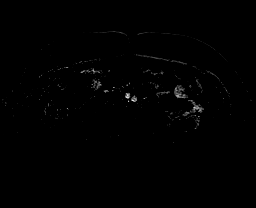
[im 24/72]
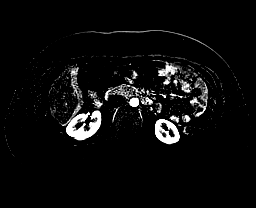
[im 48/72]
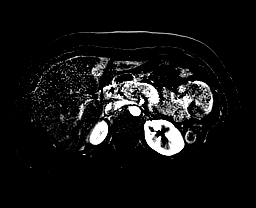
[im 72/72]
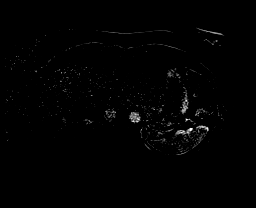

[Series 13: post 45 sec · axial · 2.3mm · 1.41mm/px · z∈[-30,+133]mm · 4 of 72 slices shown]
[im 1/72]
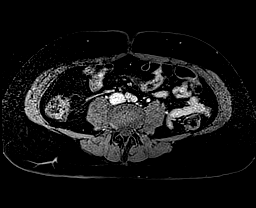
[im 24/72]
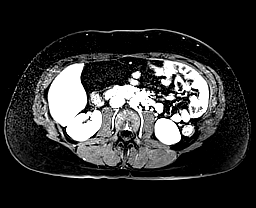
[im 48/72]
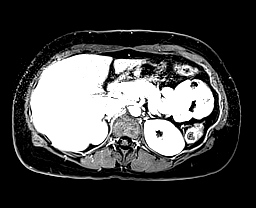
[im 72/72]
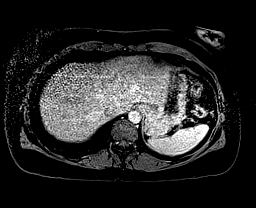

[Series 14: post 45 sec_sub · axial · 2.3mm · 1.41mm/px · z∈[-30,+133]mm · 4 of 72 slices shown]
[im 1/72]
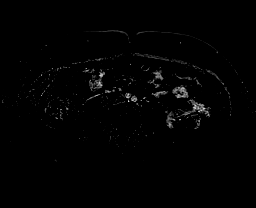
[im 24/72]
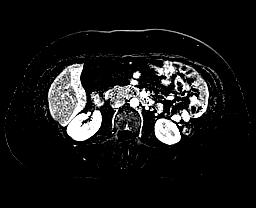
[im 48/72]
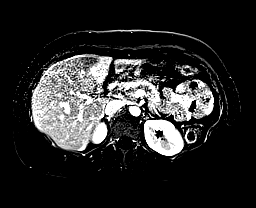
[im 72/72]
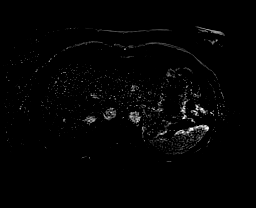

[Series 15: post 90 sec · axial · 2.3mm · 1.41mm/px · z∈[-30,+133]mm · 4 of 72 slices shown]
[im 1/72]
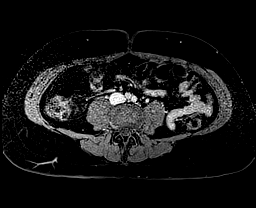
[im 24/72]
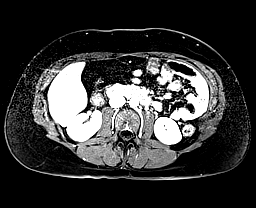
[im 48/72]
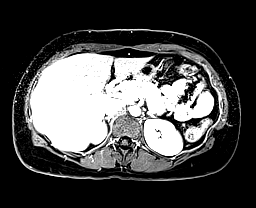
[im 72/72]
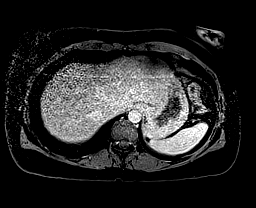

[Series 16: post 90 sec_sub · axial · 2.3mm · 1.41mm/px · z∈[-30,+133]mm · 4 of 72 slices shown]
[im 1/72]
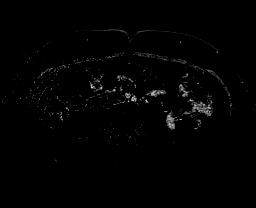
[im 24/72]
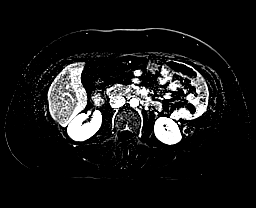
[im 48/72]
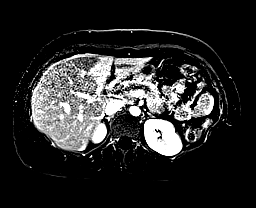
[im 72/72]
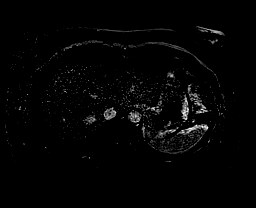

[Series 17: T1 dynamic post-contrast · coronal · 2.6mm · 0.78mm/px · 3 of 52 slices shown]
[im 1/52]
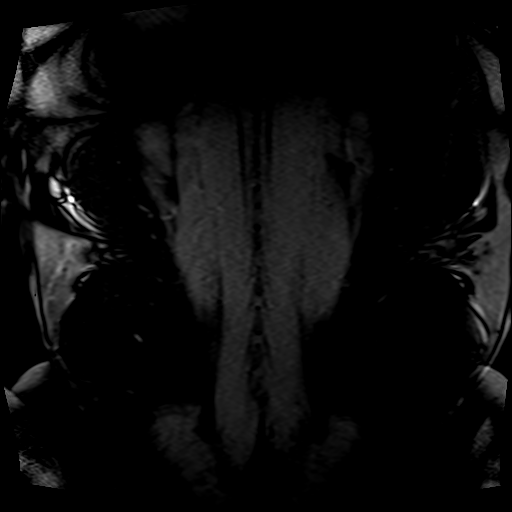
[im 26/52]
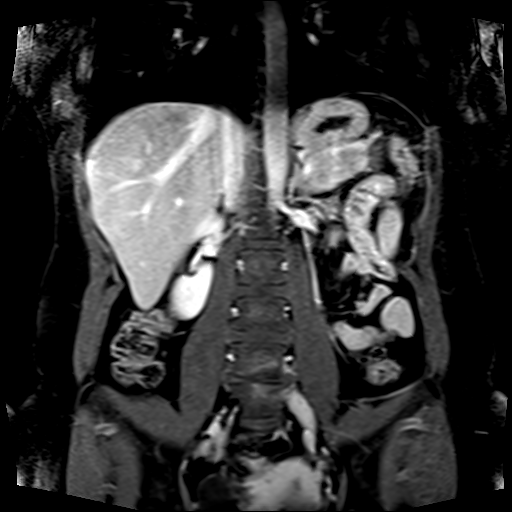
[im 52/52]
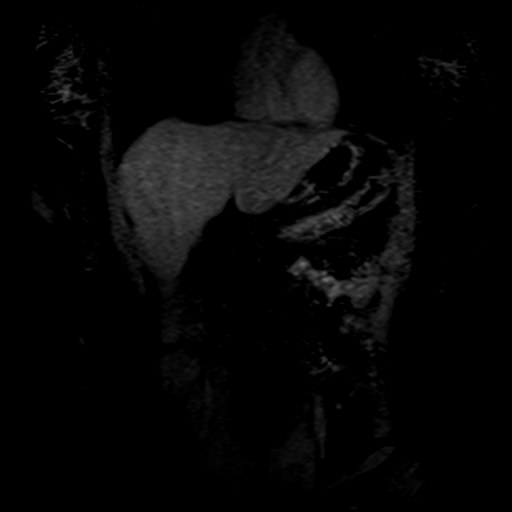

[Series 18: post axial 3+ · axial · 2.3mm · 1.41mm/px · z∈[-30,+133]mm · 4 of 72 slices shown (1 of 2)]
[im 1/72]
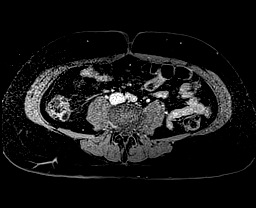
[im 24/72]
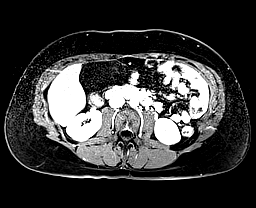
[im 48/72]
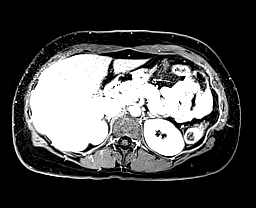
[im 72/72]
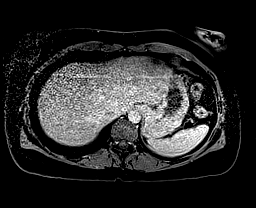

[Series 19: post axial 3+ · axial · 2.3mm · 1.41mm/px · z∈[-30,+133]mm · 4 of 72 slices shown (2 of 2)]
[im 1/72]
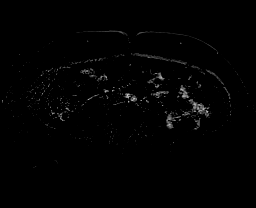
[im 24/72]
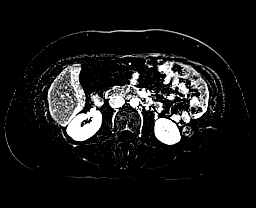
[im 48/72]
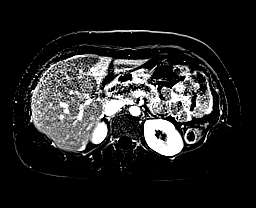
[im 72/72]
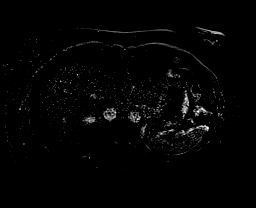

[48 of 48 positions shown; findings below may reference images not displayed]

FINDINGS: Lower chest: No acute findings.

Hepatobiliary: No mass or other parenchymal abnormality identified.

Pancreas: No mass, inflammatory changes, or other parenchymal
abnormality identified.

Spleen:  Within normal limits in size and appearance.

Adrenals/Urinary Tract: Normal appearance of the adrenal glands. The
kidneys are normal in size shape and configuration. No renal anomaly
identified.

Stomach/Bowel: Visualized portions within the abdomen are
unremarkable.

Vascular/Lymphatic: No pathologically enlarged lymph nodes
identified. No abdominal aortic aneurysm demonstrated.

Other:  None.

Musculoskeletal: No suspicious bone lesions identified.
IMPRESSION: 1. Normal abdominal sonogram.  No renal anomaly identified.

ADDENDUM:
This is a correction to the original report.

The impression should read:

1. Normal abdominal MRI.  No renal anomaly identified.

*** End of Addendum ***

## 2019-04-03 DIAGNOSIS — N96 Recurrent pregnancy loss: Secondary | ICD-10-CM | POA: Diagnosis not present

## 2019-04-07 DIAGNOSIS — Z1231 Encounter for screening mammogram for malignant neoplasm of breast: Secondary | ICD-10-CM | POA: Diagnosis not present

## 2019-07-03 ENCOUNTER — Other Ambulatory Visit: Payer: Self-pay

## 2019-07-03 ENCOUNTER — Telehealth: Payer: Self-pay | Admitting: Medical

## 2019-07-03 ENCOUNTER — Ambulatory Visit (INDEPENDENT_AMBULATORY_CARE_PROVIDER_SITE_OTHER): Payer: BC Managed Care – PPO | Admitting: Medical

## 2019-07-03 VITALS — BP 116/80 | HR 76 | Temp 96.9°F | Resp 18 | Ht 66.0 in | Wt 166.2 lb

## 2019-07-03 DIAGNOSIS — R635 Abnormal weight gain: Secondary | ICD-10-CM

## 2019-07-03 DIAGNOSIS — R5383 Other fatigue: Secondary | ICD-10-CM | POA: Diagnosis not present

## 2019-07-03 DIAGNOSIS — R195 Other fecal abnormalities: Secondary | ICD-10-CM | POA: Diagnosis not present

## 2019-07-03 DIAGNOSIS — R1013 Epigastric pain: Secondary | ICD-10-CM

## 2019-07-03 LAB — VITAMIN B12: Vitamin B-12: 612 pg/mL (ref 211–911)

## 2019-07-03 LAB — CBC WITH DIFFERENTIAL/PLATELET
Basophils Absolute: 0 10*3/uL (ref 0.0–0.1)
Basophils Relative: 0.7 % (ref 0.0–3.0)
Eosinophils Absolute: 0.1 10*3/uL (ref 0.0–0.7)
Eosinophils Relative: 2 % (ref 0.0–5.0)
HCT: 41.2 % (ref 36.0–46.0)
Hemoglobin: 13.8 g/dL (ref 12.0–15.0)
Lymphocytes Relative: 21.7 % (ref 12.0–46.0)
Lymphs Abs: 1.1 10*3/uL (ref 0.7–4.0)
MCHC: 33.5 g/dL (ref 30.0–36.0)
MCV: 95.2 fl (ref 78.0–100.0)
Monocytes Absolute: 0.4 10*3/uL (ref 0.1–1.0)
Monocytes Relative: 7.6 % (ref 3.0–12.0)
Neutro Abs: 3.5 10*3/uL (ref 1.4–7.7)
Neutrophils Relative %: 68 % (ref 43.0–77.0)
Platelets: 384 10*3/uL (ref 150.0–400.0)
RBC: 4.33 Mil/uL (ref 3.87–5.11)
RDW: 13.3 % (ref 11.5–15.5)
WBC: 5.1 10*3/uL (ref 4.0–10.5)

## 2019-07-03 LAB — COMPREHENSIVE METABOLIC PANEL
ALT: 29 U/L (ref 0–35)
AST: 25 U/L (ref 0–37)
Albumin: 4.2 g/dL (ref 3.5–5.2)
Alkaline Phosphatase: 56 U/L (ref 39–117)
BUN: 14 mg/dL (ref 6–23)
CO2: 28 mEq/L (ref 19–32)
Calcium: 9.1 mg/dL (ref 8.4–10.5)
Chloride: 103 mEq/L (ref 96–112)
Creatinine, Ser: 0.8 mg/dL (ref 0.40–1.20)
GFR: 79.38 mL/min (ref >60.00)
Glucose, Bld: 94 mg/dL (ref 70–99)
Potassium: 4.1 mEq/L (ref 3.5–5.1)
Sodium: 138 mEq/L (ref 135–145)
Total Bilirubin: 0.5 mg/dL (ref 0.2–1.2)
Total Protein: 7 g/dL (ref 6.0–8.3)

## 2019-07-03 LAB — LIPASE: Lipase: 40 U/L (ref 11.0–59.0)

## 2019-07-03 LAB — TSH: TSH: 1.73 u[IU]/mL (ref 0.35–4.50)

## 2019-07-03 MED ORDER — FAMOTIDINE 20 MG PO TABS: 20.0000 mg | ORAL_TABLET | Freq: Two times a day (BID) | ORAL | 0 refills | Status: DC

## 2019-07-03 NOTE — Progress Notes (Signed)
Subjective:    Patient ID: Linda Herring, female    DOB: Feb 01, 1981, 39 y.o.   MRN: [id number]  HPI  Pt in for second time. Pt states she has been trying to get pregnant. She did finally get pregnant and then lost 2 preganancies early on. Lost 2 pregnancies/miscarriage. Last miscarriage in April. She did fertility for 2 years. And no longer will try to get pregnant. Her limit to try was 2 years.    Pt states she had various side effects which she interpreted this as birth control meds and fertility injections. Pt states stopped birth control. Pt mood, fatigue, depression bodyaches and ha with birth control. These all resolved after 3 weeks off medications.  Pt states drinking mild and eating cheese caused upset stomach. Also corn, beef , beans and spicy foods bother her. She states some heart burn at times. Feels bloated all the time. She has not been taking any medications. Pt states if drinks water with apple cider vinegar stomach feels better. Pt has avoid lactose beverage and food and still has upset stomach.  Pt did gain about 20 lb in one year.Pt has been working out and dieting but still can't loose weight. She states stomach hurts when she exercises.  Feb 20,2021   Review of Systems  Constitutional: Positive for fatigue and unexpected weight change. Negative for chills and fever.       Increased weight 20 lb.  States some fatigue.  HENT: Negative for congestion, mouth sores, postnasal drip and rhinorrhea.   Respiratory: Negative for choking, chest tightness, shortness of breath and wheezing.   Cardiovascular: Negative for chest pain and palpitations.  Gastrointestinal: Positive for abdominal distention and abdominal pain. Negative for blood in stool, constipation and nausea.  Musculoskeletal: Negative for back pain.  Skin: Negative for rash.  Neurological: Negative for dizziness, syncope, speech difficulty, weakness and headaches.  Hematological: Negative for adenopathy. Does  not bruise/bleed easily.  Psychiatric/Behavioral: Negative for behavioral problems, decreased concentration and dysphoric mood. The patient is not nervous/anxious.     Past Medical History:  Diagnosis Date  . Allergy   . Anemia   . Anorexia nervosa with bulimia   . Chicken pox   . Migraines   . Pneumonia   . Rash    Stress induced  . STD (sexually transmitted disease)    HSV1     Social History   Socioeconomic History  . Marital status: Married    Spouse name: Not on file  . Number of children: 0  . Years of education: 69  . Highest education level: Not on file  Occupational History  . Occupation: Agricultural consultant at [employer]  Tobacco Use  . Smoking status: Former Smoker    Years: 3.00  . Smokeless tobacco: Never Used  . Tobacco comment: over 12 yrs ago  Substance and Sexual Activity  . Alcohol use: No  . Drug use: No  . Sexual activity: Yes    Partners: Male    Birth control/protection: None  Other Topics Concern  . Not on file  Social History Narrative   Fun: Read, hike and travel.   Denies religious beliefs effecting health care.    Social Determinants of Health   Financial Resource Strain:   . Difficulty of Paying Living Expenses: Not on file  Food Insecurity:   . Worried About Charity fundraiser in the Last Year: Not on file  . Ran Out of Food in the Last Year: Not on file  Transportation Needs:   . Film/video editor (Medical): Not on file  . Lack of Transportation (Non-Medical): Not on file  Physical Activity:   . Days of Exercise per Week: Not on file  . Minutes of Exercise per Session: Not on file  Stress:   . Feeling of Stress : Not on file  Social Connections:   . Frequency of Communication with Friends and Family: Not on file  . Frequency of Social Gatherings with Friends and Family: Not on file  . Attends Religious Services: Not on file  . Active Member of Clubs or Organizations: Not on file  . Attends Archivist Meetings: Not  on file  . Marital Status: Not on file  Intimate Partner Violence:   . Fear of Current or Ex-Partner: Not on file  . Emotionally Abused: Not on file  . Physically Abused: Not on file  . Sexually Abused: Not on file    Past Surgical History:  Procedure Laterality Date  . DILATION AND CURETTAGE OF UTERUS    . NASAL SEPTUM SURGERY    . TUMOR REMOVAL     from face    Family History  Problem Relation Age of Onset  . Lung cancer Father   . Diabetes Father   . Heart disease Maternal Grandmother   . Heart attack Maternal Grandmother   . Other Paternal Grandfather        brain tumor  . Colon cancer Paternal Grandmother   . Cancer Sister        uterine     No Known Allergies  Current Outpatient Medications on File Prior to Visit  Medication Sig Dispense Refill  . Prenatal Vit-Fe Fumarate-FA (PRENATAL VITAMIN PO) Take by mouth.    . valACYclovir (VALTREX) 500 MG tablet 1 tablet BID x 3 days as needed (Patient not taking: Reported on 07/03/2019) 30 tablet 1   No current facility-administered medications on file prior to visit.    BP 116/80 (BP Location: Left Arm, Patient Position: Sitting, Cuff Size: Normal)   Pulse 76   Temp (!) 96.9 F (36.1 C)   Resp 18   Ht 5\' 6"  (1.676 m)   Wt 166 lb 3.2 oz (75.4 kg)   LMP 06/24/2019   SpO2 98%   BMI 26.83 kg/m       Objective:   Physical Exam  General- No acute distress. Pleasant patient. Neck- Full range of motion, no jvd Lungs- Clear, even and unlabored. Heart- regular rate and rhythm. Neurologic- CNII- XII grossly intact. Abdomen- soft, mild epigastric tenderness. +bs, no rebound or guarding.       Assessment & Plan:  For epigastric pain will will get below get cmp, cbc, lipase and h pylori. Famotadine pending test results. Might refer to GI as well.  For fatigue, will get add tsh, b12, b1, and vit d.  For occasional loose stools will order gastro panel.  For weight gain recommend try weight watchers.  Follow up  in 1 month or as needed.  30 + minutes spent with pt.  Mackie Pai, PA-C

## 2019-07-03 NOTE — Telephone Encounter (Signed)
Opened erroneous. 

## 2019-07-03 NOTE — Patient Instructions (Addendum)
For epigastric pain will will get below get cmp, cbc, lipase and h pylori. Famotadine pending test results. Might refer to GI as well.  For fatigue, will get add tsh, b12, b1, and vit d.  For occasional loose stools will order gastro panel. Consider ibs.  For weight gain recommend try weight watchers.  Follow up in 1 month or as needed.

## 2019-07-03 NOTE — Addendum Note (Signed)
Addended by: Kelle Darting A on: 07/03/2019 10:00 AM   Modules accepted: Orders

## 2019-07-04 ENCOUNTER — Telehealth: Payer: Self-pay

## 2019-07-04 ENCOUNTER — Telehealth: Payer: Self-pay | Admitting: Medical

## 2019-07-04 DIAGNOSIS — R1013 Epigastric pain: Secondary | ICD-10-CM

## 2019-07-04 LAB — H. PYLORI BREATH TEST: H. pylori Breath Test: NOT DETECTED

## 2019-07-04 NOTE — Progress Notes (Signed)
Called patient to review labs.

## 2019-07-04 NOTE — Telephone Encounter (Signed)
Referring to GI MD.

## 2019-07-04 NOTE — Telephone Encounter (Signed)
Referral to GI MD placed.

## 2019-07-04 NOTE — Telephone Encounter (Signed)
Patient still having abd pain with discomfort , and wants to know what is next due to the normal lab results

## 2019-07-06 ENCOUNTER — Other Ambulatory Visit: Payer: Self-pay

## 2019-07-06 ENCOUNTER — Encounter: Payer: Self-pay | Admitting: Nurse Practitioner

## 2019-07-06 ENCOUNTER — Ambulatory Visit: Payer: BC Managed Care – PPO | Admitting: Nurse Practitioner

## 2019-07-06 VITALS — BP 120/74 | HR 80 | Temp 97.3°F | Ht 66.0 in | Wt 164.0 lb

## 2019-07-06 DIAGNOSIS — R1013 Epigastric pain: Secondary | ICD-10-CM | POA: Diagnosis not present

## 2019-07-06 DIAGNOSIS — R1032 Left lower quadrant pain: Secondary | ICD-10-CM | POA: Diagnosis not present

## 2019-07-06 DIAGNOSIS — R12 Heartburn: Secondary | ICD-10-CM

## 2019-07-06 LAB — VITAMIN B1: Vitamin B1 (Thiamine): 11 nmol/L (ref 8–30)

## 2019-07-06 MED ORDER — DICYCLOMINE HCL 10 MG PO CAPS: 10.0000 mg | ORAL_CAPSULE | Freq: Three times a day (TID) | ORAL | 1 refills | Status: DC | PRN

## 2019-07-06 NOTE — Progress Notes (Signed)
07/06/2019 Linda Herring [id number] August 08, 1980   CHIEF COMPLAINT: Abdominal pain  HISTORY OF PRESENT ILLNESS:  Linda Herring is a 39 year old female with a past medical history of anemia, anorexia and bulimia from age 1 to 2 and migraine headaches.  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  She was referred to our office by her primary care provider Linda Pai PA-C for further evaluation regarding epigastric pain and loose stools.  Her digestive symptoms occurred after she had a miscarriage and he went on hormone injections in attempt to become pregnant.  The hormone injections resulted in causing diarrhea, variable abdominal discomfort, headaches and depression.  She developed multiple food intolerances including to dairy products, spicy food and beer which resulted in right and left mid abdominal pain and bloat.  She also has epigastric pain described as a fireball sensation which occurs approximately once weekly.  She denies having any dysphagia.  Infrequent heartburn which occurs if she eats spicy foods.  No nausea or vomiting.  She started taking Famotidine 20 mg once daily a few days ago.  No weight loss.  She is gained 20 pounds over the past year.  She exercises 4 to 5 days weekly and is frustrated that she is unable to lose weight.  She has constipation and loose stools.  She passes a loose yellow to brown mud-like stool 3 days weekly.  She passes a formed solid stool approximately once weekly.  She denies having any rectal bleeding or black stools.  She reported having an EGD done at the age of 34, possibly had ulcers.  Sister with a precancerous uterine polyp.  Paternal grandmother and maternal grandmother with history of colon cancer.   CBC Latest Ref Rng & Units 07/03/2019 11/29/2017 07/24/2017  WBC 4.0 - 10.5 K/uL 5.1 7.8 8.5  Hemoglobin 12.0 - 15.0 g/dL 13.8 12.8 12.7  Hematocrit 36.0 -  46.0 % 41.2 37.3 38.3  Platelets 150.0 - 400.0 K/uL 384.0 373.0 342  Vitamin B12: 612 CMP Latest Ref Rng & Units 07/03/2019 11/29/2017 05/25/2016  Glucose 70 - 99 mg/dL 94 93 102(H)  BUN 6 - 23 mg/dL 14 18 8   Creatinine 0.40 - 1.20 mg/dL 0.80 0.80 0.71  Sodium 135 - 145 mEq/L 138 141 138  Potassium 3.5 - 5.1 mEq/L 4.1 4.4 3.9  Chloride 96 - 112 mEq/L 103 106 102  CO2 19 - 32 mEq/L 28 26 29   Calcium 8.4 - 10.5 mg/dL 9.1 8.8 9.3  Total Protein 6.0 - 8.3 g/dL 7.0 6.4 7.8  Total Bilirubin 0.2 - 1.2 mg/dL 0.5 0.4 0.5  Alkaline Phos 39 - 117 U/L 56 50 50  AST 0 - 37 U/L 25 15 15   ALT 0 - 35 U/L 29 12 11   TSH 1.73. H. pylori breath: test negative.  Abdominal MRI 1/022020: Normal.   Past Medical History:  Diagnosis Date  . Allergy   . Anemia   . Anorexia nervosa with bulimia   . Chicken pox   . Migraines   . Pneumonia   . Rash    Stress induced  . STD (sexually transmitted disease)    HSV1   Past Surgical History:  Procedure Laterality Date  . DILATION AND CURETTAGE OF UTERUS    . NASAL SEPTUM SURGERY    . TUMOR REMOVAL     from face    Family history: FMother  63. Father died age 19 from stomach cancer, leukemia, lung cancer  he was a smoker. Maternal grandmother diagnosed with colon cancer in her 22's died at 55. Paternal grandmother with history of colon cancer late 87's. Sister with uterine polyp, precancerous. Maternal aunt IBS.   Social history: Married. 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 She drinks white wine 1 bottle lasts the entire weekend. Smoked cigarettes 1 to 2 ppd from age 69 to 49. No drug use.     Outpatient Encounter Medications as of 07/06/2019  Medication Sig  . famotidine (PEPCID) 20 MG tablet Take 1 tablet (20 mg total) by mouth 2 (two) times daily.  . Prenatal Vit-Fe Fumarate-FA (PRENATAL VITAMIN PO) Take by mouth.  . valACYclovir (VALTREX) 500 MG tablet 1 tablet BID x 3 days as needed (Patient not taking: Reported on  07/03/2019)   No facility-administered encounter medications on file as of 07/06/2019.   No Known Allergies   REVIEW OF SYSTEMS: All other systems reviewed and negative except where noted in the History of Present Illness.   PHYSICAL EXAM: LMP 06/24/2019   BP 120/74   Pulse 80   Temp (!) 97.3 F (36.3 C)   Ht 5\' 6"  (1.676 m)   Wt 164 lb (74.4 kg)   LMP 06/24/2019   BMI 26.47 kg/m  General: Well developed 39 year old female in no acute distress. Head: Normocephalic and atraumatic. Eyes:  Sclerae non-icteric, conjunctive pink. Ears: Normal auditory acuity. Mouth: Dentition intact. No ulcers or lesions.  Neck: Supple, no lymphadenopathy or thyromegaly.  Lungs: Clear bilaterally to auscultation without wheezes, crackles or rhonchi. Heart: Regular rate and rhythm. No murmur, rub or gallop appreciated.  Abdomen: Soft, non distended.  Mild generalized abdominal tenderness without rebound or guarding.  No masses. No hepatosplenomegaly. Normoactive bowel sounds x 4 quadrants.  Rectal: Deferred Musculoskeletal: Symmetrical with no gross deformities. Skin: Warm and dry. No rash or lesions on visible extremities. Extremities: No edema. Neurological: Alert oriented x 4, no focal deficits.  Psychological:  Alert and cooperative. Normal mood and affect.  ASSESSMENT AND PLAN:  5.  39 year old female with abdominal bloat and loose stools -Celiac serology, CRP and food allergy panel -Bacteria probiotic once daily -Avoid dairy products and spicy foods  2.  Right and left upper and lower abdominal pain -Continue Famotidine 20 mg 1 p.o. daily -Discussed scheduling an abdominal/pelvic CAT scan if her abdominal pains persist or worsen -Eventual EGD and colonoscopy, to further discuss at time of her follow-up appointment in 3 to 4 weeks -Dicyclomine 10 mg 1 p.o. 3 times daily as needed  3.  Family history of colon cancer     CC:  Linda Herring, Linda Miller, PA-C

## 2019-07-06 NOTE — Patient Instructions (Addendum)
If you are age 41 or older, your body mass index should be between 23-30. Your Body mass index is 26.47 kg/m. If this is out of the aforementioned range listed, please consider follow up with your Primary Care Provider.  If you are age 38 or younger, your body mass index should be between 19-25. Your Body mass index is 26.47 kg/m. If this is out of the aformentioned range listed, please consider follow up with your Primary Care Provider.   1.Continue taking Famotidine 20 mg QD 2. Take Hardin Negus bacteria probiotic 1 daily  Your provider has requested that you have lab work today. We ask that you go to our [facility] office at [address], [place], [place] [address]. Please enter through the main entrance and go to the elevator.  Press "B" on the elevator. The lab is located at the first door on the left as you exit the elevator.  We have sent the following medications to your pharmacy for you to pick up at your convenience:  Dicyclomine 10mg   Call if symptoms worsen Follow up in 3-4 weeks to discuss scheduling an EGD/Colonoscopy if your symptoms persist.  Due to recent changes in healthcare laws, you may see the results of your imaging and laboratory studies on MyChart before your provider has had a chance to review them.  We understand that in some cases there may be results that are confusing or concerning to you. Not all laboratory results come back in the same time frame and the provider may be waiting for multiple results in order to interpret others.  Please give [place] 48 hours in order for your provider to thoroughly review all the results before contacting the office for clarification of your results.   Thank you for choosing [facility] Noralyn Pick, CRNP

## 2019-07-07 ENCOUNTER — Telehealth: Payer: Self-pay | Admitting: General Surgery

## 2019-07-07 ENCOUNTER — Other Ambulatory Visit (INDEPENDENT_AMBULATORY_CARE_PROVIDER_SITE_OTHER): Payer: BC Managed Care – PPO

## 2019-07-07 DIAGNOSIS — R1032 Left lower quadrant pain: Secondary | ICD-10-CM

## 2019-07-07 DIAGNOSIS — R12 Heartburn: Secondary | ICD-10-CM

## 2019-07-07 DIAGNOSIS — R1013 Epigastric pain: Secondary | ICD-10-CM

## 2019-07-07 LAB — VITAMIN D 1,25 DIHYDROXY
Vitamin D 1, 25 (OH)2 Total: 52 pg/mL (ref 18–72)
Vitamin D2 1, 25 (OH)2: <8 pg/mL
Vitamin D3 1, 25 (OH)2: 52 pg/mL

## 2019-07-07 LAB — C-REACTIVE PROTEIN: CRP: <1 mg/dL (ref 0.5–20.0)

## 2019-07-07 LAB — IGA: IgA: 144 mg/dL (ref 68–378)

## 2019-07-07 NOTE — Telephone Encounter (Signed)
The patient went to [facility] this morning to have her labs drawn. Her AVS from yesterday stated she needed to go to [facility] for labs. I was going to give a verbal order to [facility] at [facility] for the bloodwork but they were unable to draw a food panel with out knowledge of what all is entailed. Per Jaclyn Shaggy there is a lot that is built into this panel and she is unable to identify. I spoke with the patient and explained to her why it was designated for her to go to [facility]. The patient verbalized understanding and will have her labs drawn at [facility]

## 2019-07-07 NOTE — Progress Notes (Signed)
Agree with the assessment and plan as outlined by Carl Best, NP. Low threshold for at least EGD if ongoing sxs given Fhx.

## 2019-07-10 LAB — FOOD ALLERGY PROFILE
Allergen, Salmon, f41: <0.1 kU/L
Almonds: <0.1 kU/L
CLASS: 0
CLASS: 0
CLASS: 0
CLASS: 0
CLASS: 0
CLASS: 0
CLASS: 0
CLASS: 0
CLASS: 0
CLASS: 0
CLASS: 0
Cashew IgE: <0.1 kU/L
Class: 0
Class: 0
Class: 0
Class: 0
Egg White IgE: <0.1 kU/L
Fish Cod: <0.1 kU/L
Hazelnut: <0.1 kU/L
Milk IgE: <0.1 kU/L
Peanut IgE: <0.1 kU/L
Scallop IgE: <0.1 kU/L
Sesame Seed f10: <0.1 kU/L
Shrimp IgE: <0.1 kU/L
Soybean IgE: <0.1 kU/L
Tuna IgE: <0.1 kU/L
Walnut: <0.1 kU/L
Wheat IgE: <0.1 kU/L

## 2019-07-10 LAB — INTERPRETATION:

## 2019-07-10 LAB — TISSUE TRANSGLUTAMINASE, IGA: (tTG) Ab, IgA: 1 U/mL

## 2019-07-21 ENCOUNTER — Encounter: Payer: Self-pay | Admitting: Certified Nurse Midwife

## 2019-07-25 ENCOUNTER — Other Ambulatory Visit: Payer: Self-pay | Admitting: Medical

## 2019-07-26 ENCOUNTER — Ambulatory Visit: Payer: 59 | Admitting: Medical

## 2019-08-07 ENCOUNTER — Ambulatory Visit: Payer: BC Managed Care – PPO | Admitting: Medical

## 2019-08-07 ENCOUNTER — Other Ambulatory Visit: Payer: Self-pay

## 2019-08-07 VITALS — BP 123/55 | HR 75 | Temp 98.0°F | Resp 18 | Ht 67.0 in | Wt 166.0 lb

## 2019-08-07 DIAGNOSIS — R1013 Epigastric pain: Secondary | ICD-10-CM

## 2019-08-07 NOTE — Patient Instructions (Addendum)
Your epigastric pain has been much improved with famotadine. But with your weight loss and much improved diet it is possible that you don't need medications. Do this for a week and give me update as do want to follow advise of your GI MD. Do think probiotics is a good idea.  Glad to hear that you have been successful with weight loss. Continue weight watchers and continue working out.  Follow up 2 months cpe/wellness for fasting labs. Can call back and get scheduled early morning.

## 2019-08-07 NOTE — Progress Notes (Signed)
Subjective:    Patient ID: Linda Herring, female    DOB: 12/12/1980, 39 y.o.   MRN: [id number]  HPI  Pt in for follow up.  Pt states she was drinking a lot of apple cider vinegar before I saw her. She stopped that and did start famotidine. Now stomach feels normal. No reflux type symptoms.  Pt also has lost weight with weight watchers. She has lost about 10 lbs.    Review of Systems  Constitutional: Negative for chills, fatigue and fever.  Respiratory: Negative for cough, chest tightness, shortness of breath, wheezing and stridor.   Cardiovascular: Negative for chest pain and palpitations.  Gastrointestinal: Negative for abdominal pain.  Musculoskeletal: Negative for back pain, myalgias and neck pain.  Skin: Negative for rash.  Neurological: Negative for dizziness, syncope, speech difficulty, weakness, numbness and headaches.  Hematological: Negative for adenopathy. Does not bruise/bleed easily.  Psychiatric/Behavioral: Negative for behavioral problems and confusion.    Past Medical History:  Diagnosis Date  . Allergy   . Anemia   . Anorexia nervosa with bulimia   . Chicken pox   . Migraines   . Pneumonia   . Rash    Stress induced  . STD (sexually transmitted disease)    HSV1     Social History   Socioeconomic History  . Marital status: Married    Spouse name: Not on file  . Number of children: 0  . Years of education: 31  . Highest education level: Not on file  Occupational History  . Occupation: Agricultural consultant at [employer]  Tobacco Use  . Smoking status: Former Smoker    Years: 3.00  . Smokeless tobacco: Never Used  . Tobacco comment: over 12 yrs ago  Substance and Sexual Activity  . Alcohol use: No  . Drug use: No  . Sexual activity: Yes    Partners: Male    Birth control/protection: None  Other Topics Concern  . Not on file  Social History Narrative   Fun: Read, hike and travel.   Denies religious beliefs effecting health care.    Social  Determinants of Health   Financial Resource Strain:   . Difficulty of Paying Living Expenses:   Food Insecurity:   . Worried About Charity fundraiser in the Last Year:   . Arboriculturist in the Last Year:   Transportation Needs:   . Film/video editor (Medical):   Marland Kitchen Lack of Transportation (Non-Medical):   Physical Activity:   . Days of Exercise per Week:   . Minutes of Exercise per Session:   Stress:   . Feeling of Stress :   Social Connections:   . Frequency of Communication with Friends and Family:   . Frequency of Social Gatherings with Friends and Family:   . Attends Religious Services:   . Active Member of Clubs or Organizations:   . Attends Archivist Meetings:   Marland Kitchen Marital Status:   Intimate Partner Violence:   . Fear of Current or Ex-Partner:   . Emotionally Abused:   Marland Kitchen Physically Abused:   . Sexually Abused:     Past Surgical History:  Procedure Laterality Date  . DILATION AND CURETTAGE OF UTERUS    . NASAL SEPTUM SURGERY    . TUMOR REMOVAL     from face    Family History  Problem Relation Age of Onset  . Lung cancer Father   . Diabetes Father   . Heart disease Maternal Grandmother   .  Heart attack Maternal Grandmother   . Other Paternal Grandfather        brain tumor  . Colon cancer Paternal Grandmother   . Cancer Sister        uterine     No Known Allergies  Current Outpatient Medications on File Prior to Visit  Medication Sig Dispense Refill  . dicyclomine (BENTYL) 10 MG capsule Take 1 capsule (10 mg total) by mouth every 8 (eight) hours as needed for spasms. 30 capsule 1  . famotidine (PEPCID) 20 MG tablet TAKE 1 TABLET BY MOUTH TWICE A DAY 60 tablet 0  . Multiple Vitamin (MULTIVITAMIN) tablet Take 1 tablet by mouth daily.    . valACYclovir (VALTREX) 500 MG tablet 1 tablet BID x 3 days as needed 30 tablet 1   No current facility-administered medications on file prior to visit.    BP (!) 123/55   Pulse 75   Temp 98 F (36.7  C) (Temporal)   Resp 18   Ht 5\' 7"  (1.702 m)   Wt 166 lb (75.3 kg)   LMP 07/23/2019   SpO2 99%   BMI 26.00 kg/m       Objective:   Physical Exam   General Mental Status- Alert. General Appearance- Not in acute distress.   Skin General: Color- Normal Color. Moisture- Normal Moisture.  Neck Carotid Arteries- Normal color. Moisture- Normal Moisture. No carotid bruits. No JVD.  Chest and Lung Exam Auscultation: Breath Sounds:-Normal.  Cardiovascular Auscultation:Rythm- Regular. Murmurs & Other Heart Sounds:Auscultation of the heart reveals- No Murmurs.  Abdomen Inspection:-Inspeection Normal. Palpation/Percussion:Note:No mass. Palpation and Percussion of the abdomen reveal- Non Tender, Non Distended + BS, no rebound or guarding.   Neurologic Cranial Nerve exam:- CN III-XII intact(No nystagmus), symmetric smile. Strength:- 5/5 equal and symmetric strength both upper and lower extremities.     Assessment & Plan:  Your epigastric pain has been much improved with famotadine. But with your weight loss and much improved diet it is possible that you don't need medications. Do this for a week and give me update as do want to follow advise of your GI MD. Do think probiotics is a good idea.  Glad to hear that you have been successful with weight loss.   Follow up 2 months cpe/wellness for fasting labs. Can call back and get scheduled early morning.  Time spent with patient today was 25  minutes which consisted of chart redview, discussing diagnosis, work up treatment and documentation.  Mackie Pai, PA-C

## 2019-08-21 ENCOUNTER — Other Ambulatory Visit: Payer: Self-pay | Admitting: Medical

## 2019-09-05 ENCOUNTER — Other Ambulatory Visit: Payer: Self-pay | Admitting: Medical

## 2019-10-20 ENCOUNTER — Encounter: Payer: BC Managed Care – PPO | Admitting: Medical

## 2019-11-15 DIAGNOSIS — Z01419 Encounter for gynecological examination (general) (routine) without abnormal findings: Secondary | ICD-10-CM | POA: Diagnosis not present

## 2019-11-15 DIAGNOSIS — Z3040 Encounter for surveillance of contraceptives, unspecified: Secondary | ICD-10-CM | POA: Diagnosis not present

## 2019-11-15 DIAGNOSIS — Z124 Encounter for screening for malignant neoplasm of cervix: Secondary | ICD-10-CM | POA: Diagnosis not present

## 2019-11-15 DIAGNOSIS — Z1231 Encounter for screening mammogram for malignant neoplasm of breast: Secondary | ICD-10-CM | POA: Diagnosis not present

## 2020-01-02 ENCOUNTER — Encounter: Payer: Self-pay | Admitting: Family Medicine

## 2020-01-02 ENCOUNTER — Telehealth (INDEPENDENT_AMBULATORY_CARE_PROVIDER_SITE_OTHER): Payer: BC Managed Care – PPO | Admitting: Family Medicine

## 2020-01-02 ENCOUNTER — Other Ambulatory Visit: Payer: Self-pay

## 2020-01-02 VITALS — Temp 98.5°F | Ht 67.0 in

## 2020-01-02 DIAGNOSIS — R112 Nausea with vomiting, unspecified: Secondary | ICD-10-CM | POA: Diagnosis not present

## 2020-01-02 DIAGNOSIS — R0981 Nasal congestion: Secondary | ICD-10-CM | POA: Diagnosis not present

## 2020-01-02 DIAGNOSIS — J208 Acute bronchitis due to other specified organisms: Secondary | ICD-10-CM

## 2020-01-02 MED ORDER — FLUTICASONE PROPIONATE 50 MCG/ACT NA SUSP: 2.0000 | Freq: Every day | NASAL | 6 refills | Status: DC

## 2020-01-02 MED ORDER — PROMETHAZINE-DM 6.25-15 MG/5ML PO SYRP: 5.0000 mL | ORAL_SOLUTION | Freq: Four times a day (QID) | ORAL | 0 refills | Status: DC | PRN

## 2020-01-02 MED ORDER — AZITHROMYCIN 250 MG PO TABS: ORAL_TABLET | ORAL | 0 refills | Status: DC

## 2020-01-02 NOTE — Progress Notes (Signed)
Virtual Visit via Video Note  I connected with Linda Herring on 01/02/20 at  4:20 PM EDT by a video enabled telemedicine application and verified that I am speaking with the correct person using two identifiers.  Location/ persons involoved  Patient: home alone  Provider: office   I discussed the limitations of evaluation and management by telemedicine and the availability of in person appointments. The patient expressed understanding and agreed to proceed.  History of Present Illness: Pt is home with body aches and she has an app for covid test tomorrw and her husband tested positive Saturday.  She also has cough and congestion with NV/D   Pt is taking Ibuprofen and robitussin     Observations/Objective: Vitals:   01/02/20 1624  Temp: 98.5 F (36.9 C)   Pt is in nad  Assessment and Plan: 1. Viral bronchitis z pak,  Cough med Pt to get covid test  - azithromycin (ZITHROMAX Z-PAK) 250 MG tablet; As directed  Dispense: 6 each; Refill: 0 - promethazine-dextromethorphan (PROMETHAZINE-DM) 6.25-15 MG/5ML syrup; Take 5 mLs by mouth 4 (four) times daily as needed.  Dispense: 118 mL; Refill: 0  2. Nausea and vomiting, intractability of vomiting not specified, unspecified vomiting type Cough med with phenergan   3. Nasal congestion flonase / z pak   - fluticasone (FLONASE) 50 MCG/ACT nasal spray; Place 2 sprays into both nostrils daily.  Dispense: 16 g; Refill: 6  Pt to quarantine until covid test gets back  Follow Up Instructions:    I discussed the assessment and treatment plan with the patient. The patient was provided an opportunity to ask questions and all were answered. The patient agreed with the plan and demonstrated an understanding of the instructions.   The patient was advised to call back or seek an in-person evaluation if the symptoms worsen or if the condition fails to improve as anticipated.  I provided 25 minutes of non-face-to-face time during this  encounter.   Ann Held, DO

## 2020-01-09 DIAGNOSIS — Z20828 Contact with and (suspected) exposure to other viral communicable diseases: Secondary | ICD-10-CM | POA: Diagnosis not present

## 2020-01-16 ENCOUNTER — Encounter: Payer: Self-pay | Admitting: Medical

## 2020-01-18 DIAGNOSIS — Z20822 Contact with and (suspected) exposure to covid-19: Secondary | ICD-10-CM | POA: Diagnosis not present

## 2020-03-15 ENCOUNTER — Other Ambulatory Visit: Payer: Self-pay

## 2020-03-15 ENCOUNTER — Telehealth (INDEPENDENT_AMBULATORY_CARE_PROVIDER_SITE_OTHER): Payer: BC Managed Care – PPO | Admitting: Family Medicine

## 2020-03-15 ENCOUNTER — Encounter: Payer: Self-pay | Admitting: Family Medicine

## 2020-03-15 VITALS — Temp 98.5°F | Ht 67.0 in | Wt 159.0 lb

## 2020-03-15 DIAGNOSIS — J029 Acute pharyngitis, unspecified: Secondary | ICD-10-CM | POA: Diagnosis not present

## 2020-03-15 DIAGNOSIS — J069 Acute upper respiratory infection, unspecified: Secondary | ICD-10-CM | POA: Diagnosis not present

## 2020-03-15 MED ORDER — AMOXICILLIN 875 MG PO TABS: 875.0000 mg | ORAL_TABLET | Freq: Two times a day (BID) | ORAL | 0 refills | Status: DC

## 2020-03-15 MED ORDER — LORATADINE 10 MG PO TABS: 10.0000 mg | ORAL_TABLET | Freq: Every day | ORAL | 11 refills | Status: AC

## 2020-03-15 NOTE — Progress Notes (Signed)
Virtual Visit via Video Note  I connected with Linda Herring on 03/15/20 at 11:00 AM EST by a video enabled telemedicine application and verified that I am speaking with the correct person using two identifiers.  Location: Patient: home alone  Provider: office    I discussed the limitations of evaluation and management by telemedicine and the availability of in person appointments. The patient expressed understanding and agreed to proceed.  History of Present Illness: Pt is home c/o sore throat since this am -- + congestion last several days.   + sinus pressure / headache  She is using her flonase but has not taken anything else     Past Medical History:  Diagnosis Date  . Allergy   . Anemia   . Anorexia nervosa with bulimia   . Chicken pox   . Migraines   . Pneumonia   . Rash    Stress induced  . STD (sexually transmitted disease)    HSV1   Outpatient Encounter Medications as of 03/15/2020  Medication Sig  . dicyclomine (BENTYL) 10 MG capsule Take 1 capsule (10 mg total) by mouth every 8 (eight) hours as needed for spasms.  . famotidine (PEPCID) 20 MG tablet TAKE 1 TABLET BY MOUTH TWICE A DAY  . fluticasone (FLONASE) 50 MCG/ACT nasal spray Place 2 sprays into both nostrils daily.  . Multiple Vitamin (MULTIVITAMIN) tablet Take 1 tablet by mouth daily.  . valACYclovir (VALTREX) 500 MG tablet 1 tablet BID x 3 days as needed  . amoxicillin (AMOXIL) 875 MG tablet Take 1 tablet (875 mg total) by mouth 2 (two) times daily.  Marland Kitchen loratadine (CLARITIN) 10 MG tablet Take 1 tablet (10 mg total) by mouth daily.  . [DISCONTINUED] azithromycin (ZITHROMAX Z-PAK) 250 MG tablet As directed (Patient not taking: Reported on 03/15/2020)  . [DISCONTINUED] promethazine-dextromethorphan (PROMETHAZINE-DM) 6.25-15 MG/5ML syrup Take 5 mLs by mouth 4 (four) times daily as needed. (Patient not taking: Reported on 03/15/2020)   No facility-administered encounter medications on file as of 03/15/2020.  No  Known Allergies Observations/Objective: Vitals:   03/15/20 1054  Temp: 98.5 F (36.9 C)   Pt is in nad   Assessment and Plan:  1. Pharyngitis, unspecified etiology abx and antihistamine  flonase prn   - amoxicillin (AMOXIL) 875 MG tablet; Take 1 tablet (875 mg total) by mouth 2 (two) times daily.  Dispense: 20 tablet; Refill: 0 - loratadine (CLARITIN) 10 MG tablet; Take 1 tablet (10 mg total) by mouth daily.  Dispense: 30 tablet; Refill: 11  2. Upper respiratory tract infection, unspecified type claritin / flonase  - amoxicillin (AMOXIL) 875 MG tablet; Take 1 tablet (875 mg total) by mouth 2 (two) times daily.  Dispense: 20 tablet; Refill: 0 - loratadine (CLARITIN) 10 MG tablet; Take 1 tablet (10 mg total) by mouth daily.  Dispense: 30 tablet; Refill: 11 rto prn  Follow Up Instructions:    I discussed the assessment and treatment plan with the patient. The patient was provided an opportunity to ask questions and all were answered. The patient agreed with the plan and demonstrated an understanding of the instructions.   The patient was advised to call back or seek an in-person evaluation if the symptoms worsen or if the condition fails to improve as anticipated.  I provided 25 minutes of non-face-to-face time during this encounter.   Ann Held, DO

## 2020-04-22 DIAGNOSIS — Z1231 Encounter for screening mammogram for malignant neoplasm of breast: Secondary | ICD-10-CM | POA: Diagnosis not present

## 2020-07-23 DIAGNOSIS — Z03818 Encounter for observation for suspected exposure to other biological agents ruled out: Secondary | ICD-10-CM | POA: Diagnosis not present

## 2020-07-23 DIAGNOSIS — Z20822 Contact with and (suspected) exposure to covid-19: Secondary | ICD-10-CM | POA: Diagnosis not present

## 2020-08-06 DIAGNOSIS — N925 Other specified irregular menstruation: Secondary | ICD-10-CM | POA: Diagnosis not present

## 2020-08-06 DIAGNOSIS — N978 Female infertility of other origin: Secondary | ICD-10-CM | POA: Diagnosis not present

## 2020-08-30 ENCOUNTER — Other Ambulatory Visit: Payer: Self-pay | Admitting: Obstetrics and Gynecology

## 2020-08-30 ENCOUNTER — Other Ambulatory Visit (HOSPITAL_COMMUNITY): Payer: Self-pay | Admitting: Obstetrics and Gynecology

## 2020-08-30 DIAGNOSIS — N96 Recurrent pregnancy loss: Secondary | ICD-10-CM

## 2020-09-02 DIAGNOSIS — O3680X0 Pregnancy with inconclusive fetal viability, not applicable or unspecified: Secondary | ICD-10-CM | POA: Diagnosis not present

## 2020-09-02 DIAGNOSIS — O0901 Supervision of pregnancy with history of infertility, first trimester: Secondary | ICD-10-CM | POA: Diagnosis not present

## 2020-09-02 DIAGNOSIS — O3412 Maternal care for benign tumor of corpus uteri, second trimester: Secondary | ICD-10-CM | POA: Diagnosis not present

## 2020-09-02 DIAGNOSIS — O09521 Supervision of elderly multigravida, first trimester: Secondary | ICD-10-CM | POA: Diagnosis not present

## 2020-09-04 DIAGNOSIS — O021 Missed abortion: Secondary | ICD-10-CM | POA: Diagnosis not present

## 2020-09-04 NOTE — H&P (Signed)
  Reason for admission:   Missed abortion  History:     Linda Herring is a 40 y.o. female, G5P0040, presenting at 10 weeks of gestation to undergo D&E For failed pregnancy. TVUS with Dr Burnett Harry 09/03/20 confirms absence of FHT. No anomalies identified.   Review of system:  Non-contributory   Past Medical History:   Past Medical History:  Diagnosis Date  . Allergy   . Anemia   . Anorexia nervosa with bulimia   . Chicken pox   . Migraines   . Pneumonia   . Rash    Stress induced  . STD (sexually transmitted disease)    HSV1    Allergies:  NKDA  Social History:   Married, former smoker  Family History:   Colon cancer: PGM CVD: MGM Fibroids: sister Leukemia: sister  Physical exam:    General Appearance: Alert, appropriate appearance for age. No acute distress Neck / Thyroid: Supple, no masses, nodes or enlargement Lungs: clear to auscultation bilaterally Back: No CVA tenderness. Cardiovascular: Regular rate and rhythm. S1, S2, no murmur Gastrointestinal: Soft, non-tender, no masses or organomegaly Pelvic Exam: deferred   Assessment:   Missed AB 10 weeks by date 8 weeks by TVUS   Plan:    Proceed with D&E under IV sedation. Procedure, risks and benefits reviewed with patient  including but not limited to bleeding, infection, injury to uterus and retained products of conception.  Patient voices understanding and is agreeable to proceed.  Blood type: B+    Linda Herring A MD

## 2020-09-05 ENCOUNTER — Other Ambulatory Visit: Payer: Self-pay

## 2020-09-05 ENCOUNTER — Encounter (HOSPITAL_COMMUNITY)
Admission: RE | Admit: 2020-09-05 | Discharge: 2020-09-05 | Disposition: A | Discharge status: Home / Self Care | Payer: BC Managed Care – PPO | Source: Ambulatory Visit | Attending: Obstetrics and Gynecology | Admitting: Obstetrics and Gynecology

## 2020-09-05 ENCOUNTER — Encounter (HOSPITAL_BASED_OUTPATIENT_CLINIC_OR_DEPARTMENT_OTHER): Payer: Self-pay | Admitting: Obstetrics and Gynecology

## 2020-09-05 ENCOUNTER — Other Ambulatory Visit: Payer: Self-pay | Admitting: Obstetrics and Gynecology

## 2020-09-05 ENCOUNTER — Other Ambulatory Visit (HOSPITAL_COMMUNITY)
Admission: RE | Admit: 2020-09-05 | Discharge: 2020-09-05 | Disposition: A | Discharge status: Home / Self Care | Payer: BC Managed Care – PPO | Source: Ambulatory Visit | Attending: Obstetrics and Gynecology | Admitting: Obstetrics and Gynecology

## 2020-09-05 DIAGNOSIS — Z01812 Encounter for preprocedural laboratory examination: Secondary | ICD-10-CM | POA: Insufficient documentation

## 2020-09-05 DIAGNOSIS — Z20822 Contact with and (suspected) exposure to covid-19: Secondary | ICD-10-CM | POA: Insufficient documentation

## 2020-09-05 LAB — BASIC METABOLIC PANEL
Anion gap: 9 (ref 5–15)
BUN: 15 mg/dL (ref 6–20)
CO2: 23 mmol/L (ref 22–32)
Calcium: 9 mg/dL (ref 8.9–10.3)
Chloride: 105 mmol/L (ref 98–111)
Creatinine, Ser: 0.65 mg/dL (ref 0.44–1.00)
GFR, Estimated: >60 mL/min (ref >60)
Glucose, Bld: 100 mg/dL — ABNORMAL HIGH (ref 70–99)
Potassium: 4 mmol/L (ref 3.5–5.1)
Sodium: 137 mmol/L (ref 135–145)

## 2020-09-05 LAB — CBC
HCT: 39.3 % (ref 36.0–46.0)
Hemoglobin: 13.1 g/dL (ref 12.0–15.0)
MCH: 31.3 pg (ref 26.0–34.0)
MCHC: 33.3 g/dL (ref 30.0–36.0)
MCV: 94 fL (ref 80.0–100.0)
Platelets: 338 10*3/uL (ref 150–400)
RBC: 4.18 MIL/uL (ref 3.87–5.11)
RDW: 12.4 % (ref 11.5–15.5)
WBC: 6.6 10*3/uL (ref 4.0–10.5)
nRBC: 0 % (ref 0.0–0.2)

## 2020-09-05 NOTE — Progress Notes (Signed)
Spoke w/ via phone for pre-op interview--- PT Lab needs dos---- no              Lab results------ pt getting CBC, BMP done 09-05-2020 @ 1030 COVID test ------ 09-05-2020 @ 1140  Arrive at ------- 1100 on 09-06-2020 NPO after MN NO Solid Food.  Clear liquids from MN until--- 1000 Med rec completed Medications to take morning of surgery ----- NONE  Diabetic medication ----- n/a  Patient instructed to bring photo id and insurance card day of surgery Patient aware to have Driver (ride ) / caregiver    for 24 hours after surgery -- husband, Mitzi Hansen  Patient Special Instructions ----- at lab appt today will pick bag with ensure pre-surgery drink and handout instructions Pre-Op special Istructions ----- called and left message for Marzetta Board, OR scheduler for Dr Cletis Media, requested for pre-op orders be second signed asap for lab appt at 1030 today  Patient verbalized understanding of instructions that were given at this phone interview. Patient denies shortness of breath, chest pain, fever, cough at this phone interview.

## 2020-09-06 ENCOUNTER — Encounter (HOSPITAL_BASED_OUTPATIENT_CLINIC_OR_DEPARTMENT_OTHER): Payer: Self-pay | Admitting: Anesthesiology

## 2020-09-06 ENCOUNTER — Ambulatory Visit (HOSPITAL_BASED_OUTPATIENT_CLINIC_OR_DEPARTMENT_OTHER)
Admission: RE | Admit: 2020-09-06 | Payer: BC Managed Care – PPO | Source: Home / Self Care | Admitting: Obstetrics and Gynecology

## 2020-09-06 HISTORY — DX: Personal history of diseases of the blood and blood-forming organs and certain disorders involving the immune mechanism: Z86.2

## 2020-09-06 HISTORY — DX: Herpesviral infection, unspecified: B00.9

## 2020-09-06 HISTORY — DX: Complete or unspecified spontaneous abortion without complication: O03.9

## 2020-09-06 HISTORY — DX: Personal history of other diseases of the nervous system and sense organs: Z86.69

## 2020-09-06 HISTORY — DX: Other allergic rhinitis: J30.89

## 2020-09-06 HISTORY — DX: Personal history of COVID-19: Z86.16

## 2020-09-06 LAB — SARS CORONAVIRUS 2 (TAT 6-24 HRS): SARS Coronavirus 2: NEGATIVE

## 2020-09-06 SURGERY — DILATION AND EVACUATION, UTERUS
Anesthesia: Choice

## 2020-09-06 NOTE — Anesthesia Preprocedure Evaluation (Deleted)
Anesthesia Evaluation    Reviewed: Allergy & Precautions, Patient's Chart, lab work & pertinent test results  Airway        Dental   Pulmonary neg pulmonary ROS, former smoker,           Cardiovascular negative cardio ROS       Neuro/Psych negative neurological ROS     GI/Hepatic negative GI ROS, Neg liver ROS,   Endo/Other  negative endocrine ROS  Renal/GU negative Renal ROS     Musculoskeletal negative musculoskeletal ROS (+)   Abdominal   Peds  Hematology negative hematology ROS (+)   Anesthesia Other Findings   Reproductive/Obstetrics                             Anesthesia Physical Anesthesia Plan  ASA: II  Anesthesia Plan: General   Post-op Pain Management:    Induction: Intravenous  PONV Risk Score and Plan: 4 or greater and Ondansetron, Dexamethasone, Midazolam and Scopolamine patch - Pre-op  Airway Management Planned: LMA  Additional Equipment:   Intra-op Plan:   Post-operative Plan: Extubation in OR  Informed Consent:   Plan Discussed with: Anesthesiologist  Anesthesia Plan Comments:         Anesthesia Quick Evaluation

## 2020-09-23 DIAGNOSIS — R1900 Intra-abdominal and pelvic swelling, mass and lump, unspecified site: Secondary | ICD-10-CM | POA: Diagnosis not present

## 2020-09-23 DIAGNOSIS — O039 Complete or unspecified spontaneous abortion without complication: Secondary | ICD-10-CM | POA: Diagnosis not present

## 2020-09-23 DIAGNOSIS — Z3040 Encounter for surveillance of contraceptives, unspecified: Secondary | ICD-10-CM | POA: Diagnosis not present

## 2020-10-03 DIAGNOSIS — Z8759 Personal history of other complications of pregnancy, childbirth and the puerperium: Secondary | ICD-10-CM | POA: Diagnosis not present

## 2020-10-03 DIAGNOSIS — R1900 Intra-abdominal and pelvic swelling, mass and lump, unspecified site: Secondary | ICD-10-CM | POA: Diagnosis not present

## 2021-02-25 ENCOUNTER — Encounter: Payer: Self-pay | Admitting: *Deleted

## 2021-02-26 ENCOUNTER — Encounter: Payer: Self-pay | Admitting: Neurology

## 2021-02-26 ENCOUNTER — Other Ambulatory Visit: Payer: Self-pay | Admitting: Neurology

## 2021-02-26 ENCOUNTER — Ambulatory Visit: Payer: BC Managed Care – PPO | Admitting: Neurology

## 2021-02-26 VITALS — BP 111/76 | HR 62 | Ht 66.0 in | Wt 166.3 lb

## 2021-02-26 DIAGNOSIS — H5713 Ocular pain, bilateral: Secondary | ICD-10-CM | POA: Diagnosis not present

## 2021-02-26 DIAGNOSIS — H5350 Unspecified color vision deficiencies: Secondary | ICD-10-CM

## 2021-02-26 DIAGNOSIS — R51 Headache with orthostatic component, not elsewhere classified: Secondary | ICD-10-CM

## 2021-02-26 DIAGNOSIS — G43109 Migraine with aura, not intractable, without status migrainosus: Secondary | ICD-10-CM | POA: Diagnosis not present

## 2021-02-26 DIAGNOSIS — H579 Unspecified disorder of eye and adnexa: Secondary | ICD-10-CM | POA: Diagnosis not present

## 2021-02-26 DIAGNOSIS — H5710 Ocular pain, unspecified eye: Secondary | ICD-10-CM

## 2021-02-26 DIAGNOSIS — H539 Unspecified visual disturbance: Secondary | ICD-10-CM

## 2021-02-26 DIAGNOSIS — R519 Headache, unspecified: Secondary | ICD-10-CM | POA: Diagnosis not present

## 2021-02-26 DIAGNOSIS — H532 Diplopia: Secondary | ICD-10-CM

## 2021-02-26 DIAGNOSIS — H538 Other visual disturbances: Secondary | ICD-10-CM

## 2021-02-26 MED ORDER — RIZATRIPTAN BENZOATE 10 MG PO TBDP: 10.0000 mg | ORAL_TABLET | ORAL | 11 refills | Status: DC | PRN

## 2021-02-26 MED ORDER — ONDANSETRON 4 MG PO TBDP: 4.0000 mg | ORAL_TABLET | Freq: Three times a day (TID) | ORAL | 3 refills | Status: AC | PRN

## 2021-02-26 NOTE — Patient Instructions (Addendum)
MRI of the brain and orbits/eyes w/wo contrast If negative treat for migraines:  Topiramate/Topamax preventative Acute/emergent: Rizatriptan(Maxalt) Please take one tablet at the onset of your headache. If it does not improve the symptoms please take one additional tablet. Do not take more then 2 tablets in 24hrs. Do not take use more then 2 to 3 times in a week. Nausea: Nausea' [email or web address]   There is increased risk for stroke in women with migraine with aura and a contraindication for the combined contraceptive pill for use by women who have migraine with aura. The risk for women with migraine without aura is lower. However other risk factors like smoking are far more likely to increase stroke risk than migraine. There is a recommendation for no smoking and for the use of OCPs without estrogen such as progestogen only pills particularly for women with migraine with aura.Marland Kitchen People who have migraine headaches with auras may be 3 times more likely to have a stroke caused by a blood clot, compared to migraine patients who don't see auras. Women who take hormone-replacement therapy may be 30 percent more likely to suffer a clot-based stroke than women not taking medication containing estrogen. Other risk factors like smoking and high blood pressure may be  much more important. Occipital Neuralgia Occipital neuralgia is a type of headache that causes brief episodes of very bad pain in the back of the head. Pain from occipital neuralgia may spread (radiate) to other parts of the head. These headaches may be caused by irritation of the nerves that leave the spinal cord high up in the neck, just below the base of the skull (occipital nerves). The occipital nerves transmit sensations from the back of the head, the top of the head, and the areas behind the ears. What are the causes? This condition can occur without any known cause (primary headache syndrome). In other cases, this condition is caused by pressure  on or irritation of one of the two occipital nerves. Pressure and irritation may be due to: Muscle spasm in the neck. Neck injury. Wear and tear of the vertebrae in the neck (osteoarthritis). Disease of the disks that separate the vertebrae. Swollen blood vessels that put pressure on the occipital nerves. Infections. Tumors. Diabetes. What are the signs or symptoms? This condition causes brief burning, stabbing, electric, shocking, or shooting pain in the back of the head that can radiate to the top of the head. It can happen on one side or both sides of the head. It can also cause: Pain behind the eye. Pain triggered by neck movement or hair brushing. Scalp tenderness. Aching in the back of the head between episodes of very bad pain. Pain that gets worse with exposure to bright lights. How is this diagnosed? Your health care provider may diagnose the condition based on a physical exam and your symptoms. Tests may be done, such as: Imaging studies of the brain and neck (cervical spine), such as an MRI or CT scan. These look for causes of pinched nerves. Applying pressure to the nerves in the neck to try to re-create the pain. Injection of numbing medicine into the occipital nerve areas to see if pain goes away (diagnostic nerve block). How is this treated? Treatment for this condition may begin with simple measures, such as: Rest. Massage. Applying heat or cold to the area. Over-the-counter pain relievers. If these measures do not work, you may need other treatments, including: Medicines, such as: Prescription-strength anti-inflammatory medicines. Muscle relaxants. Anti-seizure medicines, which can  relieve pain. Antidepressants, which can relieve pain. Injected medicines, such as medicines that numb the area (local anesthetic) and steroids. Pulsed radiofrequency ablation. This is when wires are implanted to deliver electrical impulses that block pain signals from the occipital  nerve. Surgery to relieve nerve pressure. Physical therapy. Follow these instructions at home: Managing pain   Avoid any activities that cause pain. Rest when you have an attack of pain. Try gentle massage to relieve pain. Try a different pillow or sleeping position. If directed, apply heat to the affected area as often as told by your health care provider. Use the heat source that your health care provider recommends, such as a moist heat pack or a heating pad. Place a towel between your skin and the heat source. Leave the heat on for 20-30 minutes. Remove the heat if your skin turns bright red. This is especially important if you are unable to feel pain, heat, or cold. You have a greater risk of getting burned. If directed, put ice on the back of your head and neck area. To do this: Put ice in a plastic bag. Place a towel between your skin and the bag. Leave the ice on for 20 minutes, 2-3 times a day. Remove the ice if your skin turns bright red. This is very important. If you cannot feel pain, heat, or cold, you have a greater risk of damage to the area. General instructions Take over-the-counter and prescription medicines only as told by your health care provider. Avoid things that make your symptoms worse, such as bright lights. Try to stay active. Get regular exercise that does not cause pain. Ask your health care provider to suggest safe exercises for you. Work with a physical therapist to learn stretching exercises you can do at home. Practice good posture. Keep all follow-up visits. This is important. Contact a health care provider if: Your medicine is not working. You have new or worsening symptoms. Get help right away if: You have very bad head pain that does not go away. You have a sudden change in vision, balance, or speech. These symptoms may represent a serious problem that is an emergency. Do not wait to see if the symptoms will go away. Get medical help right away.  Call your local emergency services (911 in the [place] Do not drive yourself to the hospital. Summary Occipital neuralgia is a type of headache that causes brief episodes of very bad pain in the back of the head. Pain from occipital neuralgia may spread (radiate) to other parts of the head. Treatment for this condition includes rest, massage, and medicines. This information is not intended to replace advice given to you by your health care provider. Make sure you discuss any questions you have with your health care provider. Document Revised: 02/18/2020 Document Reviewed: 02/18/2020 Elsevier Patient Education  2022 [employer].  Migraine Headache A migraine headache is an intense, throbbing pain on one side or both sides of the head. Migraine headaches may also cause other symptoms, such as nausea, vomiting, and sensitivity to light and noise. A migraine headache can last from 4 hours to 3 days. Talk with your doctor about what things may bring on (trigger) your migraine headaches. What are the causes? The exact cause of this condition is not known. However, a migraine may be caused when nerves in the brain become irritated and release chemicals that cause inflammation of blood vessels. This inflammation causes pain. This condition may be triggered or caused by: Drinking alcohol.  Smoking. Taking medicines, such as: Medicine used to treat chest pain (nitroglycerin). Birth control pills. Estrogen. Certain blood pressure medicines. Eating or drinking products that contain nitrates, glutamate, aspartame, or tyramine. Aged cheeses, chocolate, or caffeine may also be triggers. Doing physical activity. Other things that may trigger a migraine headache include: Menstruation. Pregnancy. Hunger. Stress. Lack of sleep or too much sleep. Weather changes. Fatigue. What increases the risk? The following factors may make you more likely to experience migraine headaches: Being a certain age. This  condition is more common in people who are 22-45 years old. Being female. Having a family history of migraine headaches. Being Caucasian. Having a mental health condition, such as depression or anxiety. Being obese. What are the signs or symptoms? The main symptom of this condition is pulsating or throbbing pain. This pain may: Happen in any area of the head, such as on one side or both sides. Interfere with daily activities. Get worse with physical activity. Get worse with exposure to bright lights or loud noises. Other symptoms may include: Nausea. Vomiting. Dizziness. General sensitivity to bright lights, loud noises, or smells. Before you get a migraine headache, you may get warning signs (an aura). An aura may include: Seeing flashing lights or having blind spots. Seeing bright spots, halos, or zigzag lines. Having tunnel vision or blurred vision. Having numbness or a tingling feeling. Having trouble talking. Having muscle weakness. Some people have symptoms after a migraine headache (postdromal phase), such as: Feeling tired. Difficulty concentrating. How is this diagnosed? A migraine headache can be diagnosed based on: Your symptoms. A physical exam. Tests, such as: CT scan or an MRI of the head. These imaging tests can help rule out other causes of headaches. Taking fluid from the spine (lumbar puncture) and analyzing it (cerebrospinal fluid analysis, or CSF analysis). How is this treated? This condition may be treated with medicines that: Relieve pain. Relieve nausea. Prevent migraine headaches. Treatment for this condition may also include: Acupuncture. Lifestyle changes like avoiding foods that trigger migraine headaches. Biofeedback. Cognitive behavioral therapy. Follow these instructions at home: Medicines Take over-the-counter and prescription medicines only as told by your health care provider. Ask your health care provider if the medicine prescribed to  you: Requires you to avoid driving or using heavy machinery. Can cause constipation. You may need to take these actions to prevent or treat constipation: Drink enough fluid to keep your urine pale yellow. Take over-the-counter or prescription medicines. Eat foods that are high in fiber, such as beans, whole grains, and fresh fruits and vegetables. Limit foods that are high in fat and processed sugars, such as fried or sweet foods. Lifestyle Do not drink alcohol. Do not use any products that contain nicotine or tobacco, such as cigarettes, e-cigarettes, and chewing tobacco. If you need help quitting, ask your health care provider. Get at least 8 hours of sleep every night. Find ways to manage stress, such as meditation, deep breathing, or yoga. General instructions   Keep a journal to find out what may trigger your migraine headaches. For example, write down: What you eat and drink. How much sleep you get. Any change to your diet or medicines. If you have a migraine headache: Avoid things that make your symptoms worse, such as bright lights. It may help to lie down in a dark, quiet room. Do not drive or use heavy machinery. Ask your health care provider what activities are safe for you while you are experiencing symptoms. Keep all follow-up visits as  told by your health care provider. This is important. Contact a health care provider if: You develop symptoms that are different or more severe than your usual migraine headache symptoms. You have more than 15 headache days in one month. Get help right away if: Your migraine headache becomes severe. Your migraine headache lasts longer than 72 hours. You have a fever. You have a stiff neck. You have vision loss. Your muscles feel weak or like you cannot control them. You start to lose your balance often. You have trouble walking. You faint. You have a seizure. Summary A migraine headache is an intense, throbbing pain on one side or  both sides of the head. Migraines may also cause other symptoms, such as nausea, vomiting, and sensitivity to light and noise. This condition may be treated with medicines and lifestyle changes. You may also need to avoid certain things that trigger a migraine headache. Keep a journal to find out what may trigger your migraine headaches. Contact your health care provider if you have more than 15 headache days in a month or you develop symptoms that are different or more severe than your usual migraine headache symptoms. This information is not intended to replace advice given to you by your health care provider. Make sure you discuss any questions you have with your health care provider. Document Revised: 08/12/2018 Document Reviewed: 06/02/2018 Elsevier Patient Education  [place] Tablets What is this medication? RIZATRIPTAN (rye za TRIP tan) treats migraines. It works by blocking pain signals and narrowing blood vessels in the brain. It belongs to a group of medications called triptans. It is not used to prevent migraines. This medicine may be used for other purposes; ask your health care provider or pharmacist if you have questions. COMMON BRAND NAME(S): Maxalt-MLT What should I tell my care team before I take this medication? They need to know if you have any of these conditions: Cigarette smoker Circulation problems in fingers and toes Diabetes Heart disease High blood pressure High cholesterol History of irregular heartbeat History of stroke Kidney disease Liver disease Stomach or intestine problems An unusual or allergic reaction to rizatriptan, other medications, foods, dyes, or preservatives Pregnant or trying to get pregnant Breast-feeding How should I use this medication? Take this medication by mouth. Follow the directions on the prescription label. Leave the tablet in the sealed blister pack until you are ready to take it. With dry  hands, open the blister and gently remove the tablet. If the tablet breaks or crumbles, throw it away and take a new tablet out of the blister pack. Place the tablet in the mouth and allow it to dissolve, and then swallow. Do not cut, crush, or chew this medication. You do not need water to take this medication. Do not take it more often than directed. Talk to your care team regarding the use of this medication in children. While this medication may be prescribed for children as young as 6 years for selected conditions, precautions do apply. Overdosage: If you think you have taken too much of this medicine contact a poison control center or emergency room at once. NOTE: This medicine is only for you. Do not share this medicine with others. What if I miss a dose? This does not apply. This medication is not for regular use. What may interact with this medication? Do not take this medication with any of the following medications: Certain medications for migraine headache like almotriptan, eletriptan, frovatriptan, naratriptan, rizatriptan,  sumatriptan, zolmitriptan Ergot alkaloids like dihydroergotamine, ergonovine, ergotamine, methylergonovine MAOIs like Carbex, Eldepryl, Marplan, Nardil, and Parnate This medication may also interact with the following medications: Certain medications for depression, anxiety, or psychotic disorders Propranolol This list may not describe all possible interactions. Give your health care provider a list of all the medicines, herbs, non-prescription drugs, or dietary supplements you use. Also tell them if you smoke, drink alcohol, or use illegal drugs. Some items may interact with your medicine. What should I watch for while using this medication? Visit your care team for regular checks on your progress. Tell your care team if your symptoms do not start to get better or if they get worse. You may get drowsy or dizzy. Do not drive, use machinery, or do anything that needs  mental alertness until you know how this medication affects you. Do not stand up or sit up quickly, especially if you are an older patient. This reduces the risk of dizzy or fainting spells. Alcohol may interfere with the effect of this medication. Your mouth may get dry. Chewing sugarless gum or sucking hard candy and drinking plenty of water may help. Contact your care team if the problem does not go away or is severe. If you take migraine medications for 10 or more days a month, your migraines may get worse. Keep a diary of headache days and medication use. Contact your care team if your migraine attacks occur more frequently. What side effects may I notice from receiving this medication? Side effects that you should report to your care team as soon as possible: Allergic reactions-skin rash, itching, hives, swelling of the face, lips, tongue, or throat Burning, pain, tingling, or color changes in the legs or feet Heart attack-pain or tightness in the chest, shoulders, arms, or jaw, nausea, shortness of breath, cold or clammy skin, feeling faint or lightheaded Heart rhythm changes-fast or irregular heartbeat, dizziness, feeling faint or lightheaded, chest pain, trouble breathing Increase in blood pressure Irritability, confusion, fast or irregular heartbeat, muscle stiffness, twitching muscles, sweating, high fever, seizure, chills, vomiting, diarrhea, which may be signs of serotonin syndrome Raynaud's-cool, numb, or painful fingers or toes that may change color from pale, to blue, to red Seizures Stroke-sudden numbness or weakness of the face, arm, or leg, trouble speaking, confusion, trouble walking, loss of balance or coordination, dizziness, severe headache, change in vision Sudden or severe stomach pain, nausea, vomiting, fever, or bloody diarrhea Vision loss Side effects that usually do not require medical attention (report to your care team if they continue or are  bothersome): Dizziness General discomfort or fatigue This list may not describe all possible side effects. Call your doctor for medical advice about side effects. You may report side effects to FDA at 1-800-FDA-1088. Where should I keep my medication? Keep out of the reach of children and pets. Store at room temperature between 15 and 30 degrees C (59 and 86 degrees F). Protect from light and moisture. Throw away any unused medication after the expiration date. NOTE: This sheet is a summary. It may not cover all possible information. If you have questions about this medicine, talk to your doctor, pharmacist, or health care provider.  2022 Elsevier/Gold Standard (2020-05-15 16:19:19) Ondansetron Dissolving Tablets What is this medication? ONDANSETRON (on DAN se tron) prevents nausea and vomiting from chemotherapy, radiation, or surgery. It works by blocking substances in the body that may cause nausea or vomiting. It belongs to a group of medications called antiemetics. This medicine may be used  for other purposes; ask your health care provider or pharmacist if you have questions. COMMON BRAND NAME(S): Zofran ODT What should I tell my care team before I take this medication? They need to know if you have any of these conditions: Heart disease History of irregular heartbeat Liver disease Low levels of magnesium or potassium in the blood An unusual or allergic reaction to ondansetron, granisetron, other medications, foods, dyes, or preservatives Pregnant or trying to get pregnant Breast-feeding How should I use this medication? These tablets are made to dissolve in the mouth. Do not try to push the tablet through the foil backing. With dry hands, peel away the foil backing and gently remove the tablet. Place the tablet in the mouth and allow it to dissolve, then swallow. While you may take these tablets with water, it is not necessary to do so. Talk to your care team regarding the use of this  medication in children. Special care may be needed. Overdosage: If you think you have taken too much of this medicine contact a poison control center or emergency room at once. NOTE: This medicine is only for you. Do not share this medicine with others. What if I miss a dose? If you miss a dose, take it as soon as you can. If it is almost time for your next dose, take only that dose. Do not take double or extra doses. What may interact with this medication? Do not take this medication with any of the following: Apomorphine Certain medications for fungal infections like fluconazole, itraconazole, ketoconazole, posaconazole, voriconazole Cisapride Dronedarone Pimozide Thioridazine This medication may also interact with the following: Carbamazepine Certain medications for depression, anxiety, or psychotic disturbances Fentanyl Linezolid MAOIs like Carbex, Eldepryl, Marplan, Nardil, and Parnate Methylene blue (injected into a vein) Other medications that prolong the QT interval (cause an abnormal heart rhythm) like dofetilide, ziprasidone Phenytoin Rifampicin Tramadol This list may not describe all possible interactions. Give your health care provider a list of all the medicines, herbs, non-prescription drugs, or dietary supplements you use. Also tell them if you smoke, drink alcohol, or use illegal drugs. Some items may interact with your medicine. What should I watch for while using this medication? Check with your care team as soon as you can if you have any sign of an allergic reaction. What side effects may I notice from receiving this medication? Side effects that you should report to your care team as soon as possible: Allergic reactions-skin rash, itching, hives, swelling of the face, lips, tongue, or throat Bowel blockage-stomach cramping, unable to have a bowel movement or pass gas, loss of appetite, vomiting Chest pain (angina)-pain, pressure, or tightness in the chest, neck,  back, or arms Heart rhythm changes-fast or irregular heartbeat, dizziness, feeling faint or lightheaded, chest pain, trouble breathing Irritability, confusion, fast or irregular heartbeat, muscle stiffness, twitching muscles, sweating, high fever, seizure, chills, vomiting, diarrhea, which may be signs of serotonin syndrome Side effects that usually do not require medical attention (report to your care team if they continue or are bothersome): Constipation Diarrhea General discomfort and fatigue Headache This list may not describe all possible side effects. Call your doctor for medical advice about side effects. You may report side effects to FDA at 1-800-FDA-1088. Where should I keep my medication? Keep out of the reach of children and pets. Store between 2 and 30 degrees C (36 and 86 degrees F). Throw away any unused medication after the expiration date. NOTE: This sheet is a summary. It  may not cover all possible information. If you have questions about this medicine, talk to your doctor, pharmacist, or health care provider.  2022 Elsevier/Gold Standard (2020-05-10 14:39:19)   Topiramate Tablets What is this medication? TOPIRAMATE (toe PYRE a mate) prevents and controls seizures in people with epilepsy. It may also be used to prevent migraine headaches. It works by calming overactive nerves in your body. This medicine may be used for other purposes; ask your health care provider or pharmacist if you have questions. COMMON BRAND NAME(S): Topamax, Topiragen What should I tell my care team before I take this medication? They need to know if you have any of these conditions: Bleeding disorder Kidney disease Lung disease Suicidal thoughts, plans or attempt An unusual or allergic reaction to topiramate, other medications, foods, dyes, or preservatives Pregnant or trying to get pregnant Breast-feeding How should I use this medication? Take this medication by mouth with water. Take it as  directed on the prescription label at the same time every day. Do not cut, crush or chew this medicine. Swallow the tablets whole. You can take it with or without food. If it upsets your stomach, take it with food. Keep taking it unless your care team tells you to stop. A special MedGuide will be given to you by the pharmacist with each prescription and refill. Be sure to read this information carefully each time. Talk to your care team about the use of this medication in children. While it may be prescribed for children as young as 2 years for selected conditions, precautions do apply. Overdosage: If you think you have taken too much of this medicine contact a poison control center or emergency room at once. NOTE: This medicine is only for you. Do not share this medicine with others. What if I miss a dose? If you miss a dose, take it as soon as you can unless it is within 6 hours of the next dose. If it is within 6 hours of the next dose, skip the missed dose. Take the next dose at the normal time. Do not take double or extra doses. What may interact with this medication? Acetazolamide Alcohol Antihistamines for allergy, cough, and cold Aspirin and aspirin-like medications Atropine Birth control pills Certain medications for anxiety or sleep Certain medications for bladder problems like oxybutynin, tolterodine Certain medications for depression like amitriptyline, fluoxetine, sertraline Certain medications for Parkinson's disease like benztropine, trihexyphenidyl Certain medications for seizures like carbamazepine, lamotrigine, phenobarbital, phenytoin, primidone, valproic acid, zonisamide Certain medications for stomach problems like dicyclomine, hyoscyamine Certain medications for travel sickness like scopolamine Certain medications that treat or prevent blood clots like warfarin, enoxaparin, dalteparin, apixaban, dabigatran, and rivaroxaban Digoxin Diltiazem General anesthetics like  halothane, isoflurane, methoxyflurane, propofol Glyburide Hydrochlorothiazide Ipratropium Lithium Medications that relax muscles Metformin Narcotic medications for pain NSAIDs, medications for pain and inflammation, like ibuprofen or naproxen Phenothiazines like chlorpromazine, mesoridazine, prochlorperazine, thioridazine Pioglitazone This list may not describe all possible interactions. Give your health care provider a list of all the medicines, herbs, non-prescription drugs, or dietary supplements you use. Also tell them if you smoke, drink alcohol, or use illegal drugs. Some items may interact with your medicine. What should I watch for while using this medication? Visit your care team for regular checks on your progress. Tell your care team if your symptoms do not start to get better or if they get worse. Do not suddenly stop taking this medication. You may develop a severe reaction. Your care team will tell you  how much medication to take. If your care team wants you to stop the medication, the dose may be slowly lowered over time to avoid any side effects. Wear a medical ID bracelet or chain. Carry a card that describes your condition. List the medications and doses you take on the card. You may get drowsy or dizzy. Do not drive, use machinery, or do anything that needs mental alertness until you know how this medication affects you. Do not stand up or sit up quickly, especially if you are an older patient. This reduces the risk of dizzy or fainting spells. Alcohol may interfere with the effects of this medication. Avoid alcoholic drinks. This medication may cause serious skin reactions. They can happen weeks to months after starting the medication. Contact your care team right away if you notice fevers or flu-like symptoms with a rash. The rash may be red or purple and then turn into blisters or peeling of the skin. Or, you might notice a red rash with swelling of the face, lips or lymph  nodes in your neck or under your arms. Watch for new or worsening thoughts of suicide or depression. This includes sudden changes in mood, behaviors, or thoughts. These changes can happen at any time but are more common in the beginning of treatment or after a change in dose. Call your care team right away if you experience these thoughts or worsening depression. This medication may slow your child's growth if it is taken for a long time at high doses. Your care team will monitor your child's growth. Using this medication for a long time may weaken your bones. The risk of bone fractures may be increased. Talk to your care team about your bone health. Do not become pregnant while taking this medication. Hormone forms of birth control may not work as well with this medication. Talk to your care team about other forms of birth control. There is potential for serious harm to an unborn child. Tell your care team right away if you think you might be pregnant. What side effects may I notice from receiving this medication? Side effects that you should report to your care team as soon as possible: Allergic reactions-skin rash, itching, hives, swelling of the face, lips, tongue, or throat High acid level-trouble breathing, unusual weakness or fatigue, confusion, headache, fast or irregular heartbeat, nausea, vomiting High ammonia level-unusual weakness or fatigue, confusion, loss of appetite, nausea, vomiting, seizures Fever that does not go away, decrease in sweat Kidney stones-blood in the urine, pain or trouble passing urine, pain in the lower back or sides Redness, blistering, peeling or loosening of the skin, including inside the mouth Sudden eye pain or change in vision such as blurry vision, seeing halos around lights, vision loss Thoughts of suicide or self-harm, worsening mood, feelings of depression Side effects that usually do not require medical attention (report to your care team if they continue  or are bothersome): Burning or tingling sensation in hands or feet Difficulty with paying attention, memory, or speech Dizziness Drowsiness Fatigue Loss of appetite with weight loss Slow or sluggish movements of the body This list may not describe all possible side effects. Call your doctor for medical advice about side effects. You may report side effects to FDA at 1-800-FDA-1088. Where should I keep my medication? Keep out of the reach of children and pets. Store between 15 and 30 degrees C (59 and 86 degrees F). Protect from moisture. Keep the container tightly closed. Get rid of  any unused medication after the expiration date. To get rid of medications that are no longer needed or have expired: Take the medication to a medication take-back program. Check with your pharmacy or law enforcement to find a location. If you cannot return the medication, check the label or package insert to see if the medication should be thrown out in the garbage or flushed down the toilet. If you are not sure, ask your care team. If it is safe to put it in the trash, empty the medication out of the container. Mix the medication with cat litter, dirt, coffee grounds, or other unwanted substance. Seal the mixture in a bag or container. Put it in the trash. NOTE: This sheet is a summary. It may not cover all possible information. If you have questions about this medicine, talk to your doctor, pharmacist, or health care provider.  2022 Elsevier/Gold Standard (2020-06-11 12:37:42)

## 2021-02-26 NOTE — Progress Notes (Signed)
[facility]    Provider:  Dr Jaynee Eagles Requesting Provider: Stefan Church, OD Primary Care Provider:  Mackie Pai, PA-C  CC:  pain and pressue behind the eyes  HPI:  Linda Herring is a 40 y.o. female here as requested by Stefan Church, OD for migraines. PMHx anemia, migraine, HSV 1, occipital neuralgia of the right side.  Started having migraines at age of 89, worsening recently, worsened with wrong glass prescription, she also works with multiple computer screens and even with the glasses she has a pain and weird sensation behind her eyes, when she was younger she had a pain in her brain (points to the left high parietal area on the left), sharp pain more like a burning sensation, started again on Monday on the left can be really painful and she can in the setting of pink flashes andthat is when she saw a neurologist she did not have an MRI he wanted to do a nerve block and she declined. Her grandfather dies with a brain tumor paternal side and she is always concerned, she also blurry vision, pressure behind the eyes, pain on eye movement, she says performing extraocular movements makes her eyes hurtin all positions, sometimes the pain is very bad in her eye she cannot even look down, both eyes, she can have double vision and pain behind the eyes and she has coor abnormalities and dark shadows in her periphery some decreased peripheral vision. She sees white spots when moving her eyes, she will see figures in her vision, can happen daily, especially if eyes tired or has beenon computer or TV, better with rest or not on computer, pain can last 12 hours or longer, can be worse supine and at 4am and she stays in bed for 12 hours, light sensitivity, sound sensitivity, nausea, movement makes it worse, vomiting, she has 1-2 severe headaches a month but the eye pressure is continuous and daily. Sister has severe migraines. No other focal neurologic deficits, associated symptoms,  inciting events or modifiable factors.  Reviewed notes, labs and imaging from outside physicians, which showed:  Medications tried that can be used in migraine management include: Ibuprofen, hydrocodone, Toradol injections, Mobic, methocarbamol, Depo-Medrol injection, naproxen tablets, tramadol  I reviewed notes from [facility] eyewear, she was recently seen for an eye exam, very slight change from 27-20 19, where specks for computer only, if she wears back she gets headaches but if she does not wear them computer is blurry, also feels pressure behind both eyes, does have a history of migraines with aura, has a sharp electric-like pain at the top of the head brain, pain comes and goes rarely, maybe once or twice a year, not currently seen neuro, had a experience with a previous neuro doc which was bad and did not return, aided visual acuity was 20/20 OD distance and OS distance [facility] near [place] was 20 out of 20, slit-lamp examination was normal, internal ocular exam was normal, optic nerve was normal diagnosed with presbyopia and convergence insufficiency, within normal limits [facility] asked for a neurologist for migraine care, also discussed possible retrobulbar optic neuritis patient reports episodes of pain in the eye movement and pain pressure behind eyes.Worsening since May.    Review of Systems: Patient complains of symptoms per HPI as well as the following symptoms headache. Pertinent negatives and positives per HPI. All others negative.   Social History   Socioeconomic History   Marital status: Married    Spouse name: Not on file  Number of children: 0   Years of education: 16   Highest education level: Not on file  Occupational History   Occupation: Customer Specialist at [employer]  Tobacco Use   Smoking status: Former    Years: 3.00    Types: Cigarettes    Quit date: 09/06/2002    Years since quitting: 18.4   Smokeless tobacco: Never  Vaping Use   Vaping Use: Never used  Substance and  Sexual Activity   Alcohol use: Yes    Alcohol/week: 2.0 standard drinks    Types: 2 Glasses of wine per week    Comment: socail   Drug use: Never   Sexual activity: Yes    Partners: Male    Birth control/protection: None  Other Topics Concern   Not on file  Social History Narrative   Fun: Read, hike and travel.   Denies religious beliefs effecting health care.    Social Determinants of Health   Financial Resource Strain: Not on file  Food Insecurity: Not on file  Transportation Needs: Not on file  Physical Activity: Not on file  Stress: Not on file  Social Connections: Not on file  Intimate Partner Violence: Not on file    Family History  Problem Relation Age of Onset   Lung cancer Father    Diabetes Father    Cancer Sister        uterine    Migraines Sister    Migraines Maternal Aunt    Heart disease Maternal Grandmother    Heart attack Maternal Grandmother    Colon cancer Paternal Grandmother    Other Paternal Grandfather        brain tumor    Past Medical History:  Diagnosis Date   Anorexia nervosa with bulimia    Environmental and seasonal allergies    History of 2019 novel coronavirus disease (COVID-19)    per pt positive 08/ 2021 w/ moderate symtpoms that resolved in 3 weeks   History of anemia    History of migraine    HSV-1 infection    fever blisters   Miscarriage    Rash    Stress induced    Patient Active Problem List   Diagnosis Date Noted   Routine general medical examination at a health care facility 05/25/2016   Occipital neuralgia of right side 10/24/2014   Multiple allergies 09/07/2014   Migraine with aura and without status migrainosus, not intractable 09/07/2014   Bruises easily 09/07/2014    Past Surgical History:  Procedure Laterality Date   DILATION AND CURETTAGE OF UTERUS  2002  approx   for missed ab   NASAL SEPTUM SURGERY  age 24   TUMOR REMOVAL  age 15   per pt 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     BIOTIN PO Take by mouth daily.     Calcium Carbonate-Vitamin D (CALCIUM-D PO) Take by mouth daily.     Cholecalciferol (VITAMIN D3 PO) Take by mouth daily.     Cyanocobalamin (B-12 PO) Take by mouth daily.     loratadine (CLARITIN) 10 MG tablet Take 1 tablet (10 mg total) by mouth daily. (Patient taking differently: Take 10 mg by mouth daily as needed.) 30 tablet 11   ondansetron (ZOFRAN-ODT) 4 MG disintegrating tablet Take 1-2 tablets (4-8 mg total) by mouth every 8 (eight) hours as needed for nausea. 30 tablet 3  rizatriptan (MAXALT-MLT) 10 MG disintegrating tablet Take 1 tablet (10 mg total) by mouth as needed for migraine. May repeat in 2 hours if needed 9 tablet 11   valACYclovir (VALTREX) 500 MG tablet 1 tablet BID x 3 days as needed (Patient taking differently: Take 500 mg by mouth as needed. 1 tablet BID x 3 days as needed) 30 tablet 1   VITAMIN E PO Take by mouth daily.     No current facility-administered medications for this visit.    Allergies as of 02/26/2021   (No Known Allergies)    Vitals: BP 111/76   Pulse 62   Ht 5\' 6"  (1.676 m)   Wt 166 lb 4.8 oz (75.4 kg)   BMI 26.84 kg/m  Last Weight:  Wt Readings from Last 1 Encounters:  02/26/21 166 lb 4.8 oz (75.4 kg)   Last Height:   Ht Readings from Last 1 Encounters:  02/26/21 5\' 6"  (1.676 m)     Physical exam: Exam: Gen: NAD, conversant, well nourised, well groomed                     CV: RRR, no MRG. No Carotid Bruits. No peripheral edema, warm, nontender Eyes: Conjunctivae clear without exudates or hemorrhage  Neuro: Detailed Neurologic Exam  Speech:    Speech is normal; fluent and spontaneous with normal comprehension.  Cognition:    The patient is oriented to person, place, and time;     recent and remote memory intact;     language fluent;     normal attention, concentration,     fund of  knowledge Cranial Nerves:    The pupils are equal, round, and reactive to light. The fundi are normal and spontaneous venous pulsations are present. Visual fields are full to finger confrontation. Extraocular movements are intact. Trigeminal sensation is intact and the muscles of mastication are normal. The face is symmetric. The palate elevates in the midline. Hearing intact. Voice is normal. Shoulder shrug is normal. The tongue has normal motion without fasciculations.   Coordination:    Normal   Gait:     normal.   Motor Observation:    No asymmetry, no atrophy, and no involuntary movements noted. Tone:    Normal muscle tone.    Posture:    Posture is normal. normal erect    Strength:    Strength is V/V in the upper and lower limbs.      Sensation: intact to LT     Reflex Exam:  DTR's:    Deep tendon reflexes in the upper and lower extremities are normal bilaterally.   Toes:    The toes are downgoing bilaterally.   Clonus:    Clonus is absent.    Assessment/Plan: This is a 40 year old patient with a history of migraines with aura.  She is here as a referral from her eye doctor for concerns of retrobulbar optic neuritis due to concerning symptoms of eye pain and pressure, headaches, diplopia, color abnormalities, vision changes and vision loss, daily eye pressure.  Also has a family history of brain neoplasm.  Need MRI of the brain and optic nerves to evaluate for space-occupying lesion, orbital pseudotumor, retrobulbar optic neuritis, I IH, demyelinating lesion such as multiple sclerosis, cavernous sinus lesions.  MRI brain and orbits w/wo contrast as above Occipital neuralgia: discussed treatment, causes such as neck pain, reviewed images online, recommended massage, dryneedling, PT MRI of the brain and orbits/eyes w/wo contrast If negative treat for migraines:  Topiramate/Topamax preventative Acute/emergent: Rizatriptan(Maxalt) Please take one tablet at the onset of your  headache. If it does not improve the symptoms please take one additional tablet. Do not take more then 2 tablets in 24hrs. Do not take use more then 2 to 3 times in a week. Nausea: Ondansetron [email or web address] - can try this first before PT if she likes Discussed increase risk of stroke in women with migraine with aura  Orders Placed This Encounter  Procedures   MR BRAIN W WO CONTRAST   MR ORBITS W WO CONTRAST   CBC with Differential/Platelets   Comprehensive metabolic panel   TSH   Meds ordered this encounter  Medications   rizatriptan (MAXALT-MLT) 10 MG disintegrating tablet    Sig: Take 1 tablet (10 mg total) by mouth as needed for migraine. May repeat in 2 hours if needed    Dispense:  9 tablet    Refill:  11   ondansetron (ZOFRAN-ODT) 4 MG disintegrating tablet    Sig: Take 1-2 tablets (4-8 mg total) by mouth every 8 (eight) hours as needed for nausea.    Dispense:  30 tablet    Refill:  3     Cc: Domenic Moras,  [place], [place], PA-C  [place], [facility] [address] [place], [place] [address]  Phone [phone] Fax [phone]

## 2021-02-27 LAB — COMPREHENSIVE METABOLIC PANEL
ALT: 12 IU/L (ref 0–32)
AST: 19 IU/L (ref 0–40)
Albumin/Globulin Ratio: 1.7 (ref 1.2–2.2)
Albumin: 4.8 g/dL (ref 3.8–4.8)
Alkaline Phosphatase: 61 IU/L (ref 44–121)
BUN/Creatinine Ratio: 11 (ref 9–23)
BUN: 10 mg/dL (ref 6–24)
Bilirubin Total: 0.5 mg/dL (ref 0.0–1.2)
CO2: 22 mmol/L (ref 20–29)
Calcium: 10.2 mg/dL (ref 8.7–10.2)
Chloride: 99 mmol/L (ref 96–106)
Creatinine, Ser: 0.91 mg/dL (ref 0.57–1.00)
Globulin, Total: 2.8 g/dL (ref 1.5–4.5)
Glucose: 97 mg/dL (ref 70–99)
Potassium: 5.2 mmol/L (ref 3.5–5.2)
Sodium: 140 mmol/L (ref 134–144)
Total Protein: 7.6 g/dL (ref 6.0–8.5)
eGFR: 81 mL/min/{1.73_m2} (ref >59)

## 2021-02-27 LAB — CBC WITH DIFFERENTIAL/PLATELET
Basophils Absolute: 0.1 10*3/uL (ref 0.0–0.2)
Basos: 1 %
EOS (ABSOLUTE): 0.1 10*3/uL (ref 0.0–0.4)
Eos: 1 %
Hematocrit: 40.5 % (ref 34.0–46.6)
Hemoglobin: 13.7 g/dL (ref 11.1–15.9)
Immature Grans (Abs): 0 10*3/uL (ref 0.0–0.1)
Immature Granulocytes: 0 %
Lymphocytes Absolute: 1.8 10*3/uL (ref 0.7–3.1)
Lymphs: 25 %
MCH: 31.6 pg (ref 26.6–33.0)
MCHC: 33.8 g/dL (ref 31.5–35.7)
MCV: 93 fL (ref 79–97)
Monocytes Absolute: 0.6 10*3/uL (ref 0.1–0.9)
Monocytes: 8 %
Neutrophils Absolute: 4.7 10*3/uL (ref 1.4–7.0)
Neutrophils: 65 %
Platelets: 401 10*3/uL (ref 150–450)
RBC: 4.34 x10E6/uL (ref 3.77–5.28)
RDW: 12.5 % (ref 11.7–15.4)
WBC: 7.2 10*3/uL (ref 3.4–10.8)

## 2021-02-27 LAB — TSH: TSH: 2.25 u[IU]/mL (ref 0.450–4.500)

## 2021-02-27 NOTE — Telephone Encounter (Signed)
Per Dr Jaynee Eagles, switch to Sumatriptan 100 mg. Order sent to pharmacy.

## 2021-03-13 ENCOUNTER — Other Ambulatory Visit: Payer: BC Managed Care – PPO

## 2021-05-20 DIAGNOSIS — Z3040 Encounter for surveillance of contraceptives, unspecified: Secondary | ICD-10-CM | POA: Diagnosis not present

## 2021-05-20 DIAGNOSIS — Z1231 Encounter for screening mammogram for malignant neoplasm of breast: Secondary | ICD-10-CM | POA: Diagnosis not present

## 2021-05-20 DIAGNOSIS — Z01419 Encounter for gynecological examination (general) (routine) without abnormal findings: Secondary | ICD-10-CM | POA: Diagnosis not present

## 2021-05-20 DIAGNOSIS — Z6827 Body mass index (BMI) 27.0-27.9, adult: Secondary | ICD-10-CM | POA: Diagnosis not present

## 2021-05-27 ENCOUNTER — Ambulatory Visit: Payer: BC Managed Care – PPO | Admitting: Medical

## 2021-05-27 VITALS — BP 130/60 | HR 76 | Resp 20 | Ht 66.0 in | Wt 171.0 lb

## 2021-05-27 DIAGNOSIS — G5601 Carpal tunnel syndrome, right upper limb: Secondary | ICD-10-CM | POA: Diagnosis not present

## 2021-05-27 DIAGNOSIS — S46811A Strain of other muscles, fascia and tendons at shoulder and upper arm level, right arm, initial encounter: Secondary | ICD-10-CM | POA: Diagnosis not present

## 2021-05-27 DIAGNOSIS — M79671 Pain in right foot: Secondary | ICD-10-CM | POA: Diagnosis not present

## 2021-05-27 DIAGNOSIS — G629 Polyneuropathy, unspecified: Secondary | ICD-10-CM

## 2021-05-27 DIAGNOSIS — M542 Cervicalgia: Secondary | ICD-10-CM

## 2021-05-27 MED ORDER — METHYLPREDNISOLONE 4 MG PO TABS: ORAL_TABLET | ORAL | 0 refills | Status: AC

## 2021-05-27 MED ORDER — GABAPENTIN 100 MG PO CAPS: 100.0000 mg | ORAL_CAPSULE | Freq: Every day | ORAL | 0 refills | Status: AC

## 2021-05-27 NOTE — Progress Notes (Signed)
Subjective:    Patient ID: Linda Herring, female    DOB: [birth year], 41 y.o.   MRN: [id number]  HPI  Pt states she has tingling and numbness to mid lateral foot pain. Started about 2 weeks ago. She states first began when she accidentally kicked a metal bed frame. The frame hit between the 4th and 5th toe. Then gradual tingling and numbness in same area but higher in mid foot and some on lateral foot. Now having some tingling to the lateal calf area. Pt does have some low back pain with hx of sciatic issues. Normally her scitaica is on let side but rt si are is usually tender.   At night tingling of foot is bothersome.   Also some rt upper ext tingling. Her rt arm started tingling some yesterday. Pt states started tingling during the day. No tingling of hand when wakes up at night.   No ha, no dizziness, no blurred vison and nor motor defcits.  Lmp- past Friday.  Review of Systems  Constitutional:  Negative for chills, fatigue and fever.  Respiratory:  Negative for chest tightness.   Cardiovascular:  Negative for chest pain and palpitations.  Gastrointestinal:  Negative for nausea.  Musculoskeletal:  Positive for neck pain.  Skin:  Negative for rash.      Objective:   Physical Exam  General Mental Status- Alert. General Appearance- Not in acute distress.   Skin General: Color- Normal Color. Moisture- Normal Moisture.  Neck No mid cervical spine tenderness to palpation.  She does have mild right trapezius tenderness on palpation midportion.  Chest and Lung Exam Auscultation: Breath Sounds:-Normal.  Cardiovascular Auscultation:Rythm- Regular. Murmurs & Other Heart Sounds:Auscultation of the heart reveals- No Murmurs.  Abdomen Inspection:-Inspeection Normal. Palpation/Percussion:Note:No mass. Palpation and Percussion of the abdomen reveal- Non Tender, Non Distended + BS, no rebound or guarding.    Neurologic Cranial Nerve exam:- CN III-XII intact(No nystagmus),  symmetric smile. Drift Test:- No drift. Romberg Exam:- Negative.  Heal to Toe Gait exam:-Normal. Finger to Nose:- Normal/Intact Strength:- 5/5 equal and symmetric strength both upper and lower extremities.   Right upper extremity-on wrist manipulation tingling fourth and fifth digit reproduced.  Good grip strength.  Right foot-no direct tenderness over the area between fourth and fifth toe where she kicked a metal bed frame twice.  No redness, bruising or tenderness to palpation of the midfoot or lateral portion.  Back-no mid spinal tenderness palpation but does point to right SI area as source of daily tenderness.    Assessment & Plan:   Patient Instructions  Right upper extremity probable carpal tunnel syndrome.  Presently I do not think that the right hand tingling sensation is coming from cervical spine region.  We will treat with 4-day tapered Medrol and see if this helps.  Also recommend getting wrist cock-up splint and using it during the day or night if you can tolerate use.  Right foot neuropathic type pain following 2 events where he kicks metal bed frame.  Some moderate pain that day then pain subsided.  Due to mechanism of injury and persisting tingling/neuropathic pain I do think is worthwhile getting right foot x-ray.  Will prescribe gabapentin to use 1 tablet at night.  This could help with nerve pain in upper and lower extremity.  Sedation is common side effect.  Recommend she stop melatonin presently.  History of low back pain with some sciatica features.  Presently not convinced that the right foot tingling sensation is from the lower  back.  If your right foot tingling sensation persist would then would consider getting a lumbar spine x-ray.   Presently right-sided neuropathic type discomfort described.  No gross motor deficits.  If you start to have any motor issues then be seen in the emergency department.  No central etiology process appearing presently.  Follow-up in 10  days or sooner if needed.   Mackie Pai, PA-C

## 2021-05-27 NOTE — Patient Instructions (Signed)
Right upper extremity probable carpal tunnel syndrome.  Presently I do not think that the right hand tingling sensation is coming from cervical spine region.  We will treat with 4-day tapered Medrol and see if this helps.  Also recommend getting wrist cock-up splint and using it during the day or night if you can tolerate use.  Right foot neuropathic type pain following 2 events where he kicks metal bed frame.  Some moderate pain that day then pain subsided.  Due to mechanism of injury and persisting tingling/neuropathic pain I do think is worthwhile getting right foot x-ray.  Will prescribe gabapentin to use 1 tablet at night.  This could help with nerve pain in upper and lower extremity.  Sedation is common side effect.  Recommend she stop melatonin presently.  History of low back pain with some sciatica features.  Presently not convinced that the right foot tingling sensation is from the lower back.  If your right foot tingling sensation persist would then would consider getting a lumbar spine x-ray.   Presently right-sided neuropathic type discomfort described.  No gross motor deficits.  If you start to have any motor issues then be seen in the emergency department.  No central etiology process appearing presently.  Follow-up in 10 days or sooner if needed.

## 2021-06-09 ENCOUNTER — Other Ambulatory Visit: Payer: Self-pay

## 2021-06-09 ENCOUNTER — Encounter: Payer: Self-pay | Admitting: Medical

## 2021-06-09 ENCOUNTER — Ambulatory Visit (HOSPITAL_BASED_OUTPATIENT_CLINIC_OR_DEPARTMENT_OTHER)
Admission: RE | Admit: 2021-06-09 | Discharge: 2021-06-09 | Disposition: A | Discharge status: Home / Self Care | Payer: BC Managed Care – PPO | Source: Ambulatory Visit | Attending: Medical | Admitting: Medical

## 2021-06-09 ENCOUNTER — Ambulatory Visit: Payer: BC Managed Care – PPO | Admitting: Medical

## 2021-06-09 VITALS — BP 118/55 | HR 85 | Resp 18 | Ht 66.0 in | Wt 173.4 lb

## 2021-06-09 DIAGNOSIS — M79671 Pain in right foot: Secondary | ICD-10-CM

## 2021-06-09 DIAGNOSIS — M2011 Hallux valgus (acquired), right foot: Secondary | ICD-10-CM | POA: Diagnosis not present

## 2021-06-09 DIAGNOSIS — G629 Polyneuropathy, unspecified: Secondary | ICD-10-CM

## 2021-06-09 LAB — VITAMIN B12: Vitamin B-12: 278 pg/mL (ref 211–911)

## 2021-06-09 NOTE — Addendum Note (Signed)
Addended by: Anabel Halon on: 06/09/2021 12:48 PM   Modules accepted: Orders

## 2021-06-09 NOTE — Patient Instructions (Addendum)
Rt upper ext carpel tunnel syndrome tingling pain improved with wrist cock up splint and gabapentin may be helping as well. Continue both.  Rt foot tingling persists but pain sensation less(though tingling worsens with exercise). Please get xray of foot done today. Order placed on last visit. Can continue gabapentin. After xray review will decide on referral to podatrist.  Also decided to get b12 an b1 level as low level can be cause of neuropathy  Follow up date to be determined after lab and xray review.

## 2021-06-09 NOTE — Progress Notes (Signed)
Subjective:    Patient ID: Linda Herring, female    DOB: 01-25-1981, 41 y.o.   MRN: [id number]  HPI  Pt in for follow up.  Pt tells me her rt upper extremity feels better with rt wrist cock up splint use.  Pt also had rt foot discomfort and some tingling type pain. A/P for foot below in ".  "Right foot neuropathic type pain following 2 events where he kicks metal bed frame.  Some moderate pain that day then pain subsided.  Due to mechanism of injury and persisting tingling/neuropathic pain I do think is worthwhile getting right foot x-ray.  Will prescribe gabapentin to use 1 tablet at night.  This could help with nerve pain in upper and lower extremity.  Sedation is common side effect.  Recommend she stop melatonin presently."  With gabapentin pain in her foot and arm is less. But she still has tingling sensation to her foot.  I had put in xray foot order. She did not get xray. Pt states tingling in he foot will get worse with exercise.  Some occasional low back pain brief and self limited. But not radiating to her rt foot.  Review of Systems  Constitutional:  Negative for chills, fatigue and fever.  Respiratory:  Negative for cough, chest tightness, shortness of breath and wheezing.   Cardiovascular:  Negative for chest pain and palpitations.  Gastrointestinal:  Negative for abdominal pain.  Musculoskeletal:  Negative for back pain.       See hpi.   Skin:  Negative for rash.  Neurological:  Negative for dizziness, numbness and headaches.       See hpi.  Hematological:  Negative for adenopathy. Does not bruise/bleed easily.  Psychiatric/Behavioral:  Negative for behavioral problems and dysphoric mood. The patient is not nervous/anxious.    Past Medical History:  Diagnosis Date   Anorexia nervosa with bulimia    Environmental and seasonal allergies    History of 2019 novel coronavirus disease (COVID-19)    per pt positive 08/ 2021 w/ moderate symtpoms that resolved in 3 weeks    History of anemia    History of migraine    HSV-1 infection    fever blisters   Miscarriage    Rash    Stress induced     Social History   Socioeconomic History   Marital status: Married    Spouse name: Not on file   Number of children: 0   Years of education: 16   Highest education level: Not on file  Occupational History   Occupation: Agricultural consultant at [employer]  Tobacco Use   Smoking status: Former    Years: 3.00    Types: Cigarettes    Quit date: 09/06/2002    Years since quitting: 18.7   Smokeless tobacco: Never  Vaping Use   Vaping Use: Never used  Substance and Sexual Activity   Alcohol use: Yes    Alcohol/week: 2.0 standard drinks    Types: 2 Glasses of wine per week    Comment: socail   Drug use: Never   Sexual activity: Yes    Partners: Male    Birth control/protection: None  Other Topics Concern   Not on file  Social History Narrative   Fun: Read, hike and travel.   Denies religious beliefs effecting health care.    Social Determinants of Health   Financial Resource Strain: Not on file  Food Insecurity: Not on file  Transportation Needs: Not on file  Physical Activity:  Not on file  Stress: Not on file  Social Connections: Not on file  Intimate Partner Violence: Not on file    Past Surgical History:  Procedure Laterality Date   DILATION AND CURETTAGE OF UTERUS  2002  approx   for missed ab   NASAL SEPTUM SURGERY  age 54   TUMOR REMOVAL  age 61   per pt benign tumor removal from face    Family History  Problem Relation Age of Onset   Lung cancer Father    Diabetes Father    Cancer Sister        uterine    Migraines Sister    Migraines Maternal Aunt    Heart disease Maternal Grandmother    Heart attack Maternal Grandmother    Colon cancer Paternal Grandmother    Other Paternal Grandfather        brain tumor    No Known Allergies  Current Outpatient Medications on File Prior to Visit  Medication Sig Dispense Refill   Ascorbic  Acid (VITAMIN C PO) Take by mouth daily.     BIOTIN PO Take by mouth daily.     Calcium Carbonate-Vitamin D (CALCIUM-D PO) Take by mouth daily.     Cholecalciferol (VITAMIN D3 PO) Take by mouth daily.     Cyanocobalamin (B-12 PO) Take by mouth daily.     gabapentin (NEURONTIN) 100 MG capsule Take 1 capsule (100 mg total) by mouth at bedtime. 30 capsule 0   loratadine (CLARITIN) 10 MG tablet Take 1 tablet (10 mg total) by mouth daily. (Patient taking differently: Take 10 mg by mouth daily as needed.) 30 tablet 11   methylPREDNISolone (MEDROL) 4 MG tablet 4 tab po day 1, 3 tab po day 2, 2 tab po day 3, and 1 tab po day 4. 10 tablet 0   ondansetron (ZOFRAN-ODT) 4 MG disintegrating tablet Take 1-2 tablets (4-8 mg total) by mouth every 8 (eight) hours as needed for nausea. 30 tablet 3   SUMAtriptan (IMITREX) 100 MG tablet Take 100 mg by mouth at onset of migraine. May repeat in 2 hours if headache returns or persists. Do not take more than 2 tablets in 24 hours. 10 tablet 11   valACYclovir (VALTREX) 500 MG tablet 1 tablet BID x 3 days as needed (Patient taking differently: Take 500 mg by mouth as needed. 1 tablet BID x 3 days as needed) 30 tablet 1   VITAMIN E PO Take by mouth daily.     No current facility-administered medications on file prior to visit.    BP (!) 118/55    Pulse 85    Resp 18    Ht 5\' 6"  (1.676 m)    Wt 173 lb 6.4 oz (78.7 kg)    SpO2 100%    BMI 27.99 kg/m        Objective:   Physical Exam  General- No acute distress. Pleasant patient.  Neck- Full range of motion, no jvd Lungs- Clear, even and unlabored. Heart- regular rate and rhythm. Neurologic- CNII- XII grossly intact.   Right upper extremity-on wrist manipulation tingling fourth and fifth digit reproduced.  Good grip strength.   Right foot-no direct tenderness over the area between fourth and fifth toe where she kicked a metal bed frame twice.  No redness, bruising or tenderness to palpation of the midfoot or  lateral portion.   Back-no mid spinal tenderness palpation but does point to right SI area as source of daily tenderness.  Assessment & Plan:   Patient Instructions  Rt upper ext carpel tunnel syndrome tingling pain improved with wrist cock up splint and gabapentin may be helping as well. Continue both.  Rt foot tingling persists but pain sensation less(though tingling worsens with exercise). Please get xray of foot done today. Order placed on last visit. Can continue gabapentin. After xray review will decide on referral to podatrist.  Also decided to get b12 an b1 level as low level can be cause of neuropathy  Follow up date to be determined after lab and xray review.     Mackie Pai, PA-C

## 2021-06-14 LAB — VITAMIN B1: Vitamin B1 (Thiamine): 9 nmol/L (ref 8–30)

## 2021-06-24 ENCOUNTER — Ambulatory Visit (INDEPENDENT_AMBULATORY_CARE_PROVIDER_SITE_OTHER): Payer: BC Managed Care – PPO

## 2021-06-24 ENCOUNTER — Ambulatory Visit: Payer: BC Managed Care – PPO | Admitting: Podiatry

## 2021-06-24 ENCOUNTER — Other Ambulatory Visit: Payer: Self-pay

## 2021-06-24 DIAGNOSIS — R200 Anesthesia of skin: Secondary | ICD-10-CM

## 2021-06-24 DIAGNOSIS — M778 Other enthesopathies, not elsewhere classified: Secondary | ICD-10-CM | POA: Diagnosis not present

## 2021-06-24 DIAGNOSIS — M79671 Pain in right foot: Secondary | ICD-10-CM

## 2021-06-24 NOTE — Patient Instructions (Signed)
Add "alpha lipoic acid" along with the vitamin B12

## 2021-06-29 NOTE — Progress Notes (Signed)
Subjective:   Patient ID: Linda Herring, female   DOB: 41 y.o.   MRN: [id number]   HPI 41 year old female presents the office today for concerns of right foot, fifth toe pain which started 1 month ago.  She states that she hit her fifth toe while moving and now she has numbness on the side of her foot.  She states that she exercises her foot falls asleep.  She does not have any significant pain associated with this and more concerned with the numbness.  She has previously been started on gabapentin.  Also taking B complex vitamin.  She had x-rays performed previously which were negative for fracture.  She has no other concerns.   Review of Systems  All other systems reviewed and are negative.  Past Medical History:  Diagnosis Date   Anorexia nervosa with bulimia    Environmental and seasonal allergies    History of 2019 novel coronavirus disease (COVID-19)    per pt positive 08/ 2021 w/ moderate symtpoms that resolved in 3 weeks   History of anemia    History of migraine    HSV-1 infection    fever blisters   Miscarriage    Rash    Stress induced    Past Surgical History:  Procedure Laterality Date   DILATION AND CURETTAGE OF UTERUS  2002  approx   for missed ab   NASAL SEPTUM SURGERY  age 78   TUMOR REMOVAL  age 21   per pt benign tumor removal from face     Current Outpatient Medications:    Ascorbic Acid (VITAMIN C PO), Take by mouth daily., Disp: , Rfl:    BIOTIN PO, Take by mouth daily., Disp: , Rfl:    Calcium Carbonate-Vitamin D (CALCIUM-D PO), Take by mouth daily., Disp: , Rfl:    Cholecalciferol (VITAMIN D3 PO), Take by mouth daily., Disp: , Rfl:    Cyanocobalamin (B-12 PO), Take by mouth daily., Disp: , Rfl:    gabapentin (NEURONTIN) 100 MG capsule, Take 1 capsule (100 mg total) by mouth at bedtime., Disp: 30 capsule, Rfl: 0   loratadine (CLARITIN) 10 MG tablet, Take 1 tablet (10 mg total) by mouth daily. (Patient taking differently: Take 10 mg by mouth daily as  needed.), Disp: 30 tablet, Rfl: 11   methylPREDNISolone (MEDROL) 4 MG tablet, 4 tab po day 1, 3 tab po day 2, 2 tab po day 3, and 1 tab po day 4., Disp: 10 tablet, Rfl: 0   ondansetron (ZOFRAN-ODT) 4 MG disintegrating tablet, Take 1-2 tablets (4-8 mg total) by mouth every 8 (eight) hours as needed for nausea., Disp: 30 tablet, Rfl: 3   SUMAtriptan (IMITREX) 100 MG tablet, Take 100 mg by mouth at onset of migraine. May repeat in 2 hours if headache returns or persists. Do not take more than 2 tablets in 24 hours., Disp: 10 tablet, Rfl: 11   valACYclovir (VALTREX) 500 MG tablet, 1 tablet BID x 3 days as needed (Patient taking differently: Take 500 mg by mouth as needed. 1 tablet BID x 3 days as needed), Disp: 30 tablet, Rfl: 1   VITAMIN E PO, Take by mouth daily., Disp: , Rfl:   No Known Allergies        Objective:  Physical Exam  General: AAO x3, NAD  Dermatological: No open lesions are noted.  Vascular: Dorsalis Pedis artery and Posterior Tibial artery pedal pulses are 2/4 bilateral with immedate capillary fill time. There is no pain with calf compression,  swelling, warmth, erythema.   Neruologic: Sensation somewhat decreased on the dorsal lateral aspect of the right foot.  Musculoskeletal: Unable to elicit any area of pinpoint tenderness.  Particular is noted on the fifth toe and there is no edema present.  Muscular strength 5/5 in all groups tested bilateral.  Gait: Unassisted, Nonantalgic.       Assessment:   Neuritis status post injury     Plan:  -Treatment options discussed including all alternatives, risks, and complications -Etiology of symptoms were discussed -Reviewed the prior x-rays which were negative for fracture.  She has a contusion resulting in nerve injury.  She is already on gabapentin and B complex vitamin.  Add alpha lipoic acid to it as well.  Also we discussed that if her foot is feeling normal exercise we discussed changing shoes and support.  If she does  not see any improvement refer to neurology.  Discussed that once nerves get damaged she can take a while to recover.  Trula Slade DPM

## 2021-07-01 ENCOUNTER — Other Ambulatory Visit: Payer: Self-pay | Admitting: Podiatry

## 2021-07-01 DIAGNOSIS — M778 Other enthesopathies, not elsewhere classified: Secondary | ICD-10-CM

## 2021-07-19 ENCOUNTER — Encounter: Payer: Self-pay | Admitting: Medical

## 2021-07-21 MED ORDER — VALACYCLOVIR HCL 500 MG PO TABS: 500.0000 mg | ORAL_TABLET | Freq: Two times a day (BID) | ORAL | 0 refills | Status: AC

## 2021-12-13 DIAGNOSIS — H66003 Acute suppurative otitis media without spontaneous rupture of ear drum, bilateral: Secondary | ICD-10-CM | POA: Diagnosis not present

## 2021-12-13 DIAGNOSIS — Z6827 Body mass index (BMI) 27.0-27.9, adult: Secondary | ICD-10-CM | POA: Diagnosis not present

## 2022-01-08 DIAGNOSIS — R52 Pain, unspecified: Secondary | ICD-10-CM | POA: Diagnosis not present

## 2022-01-08 DIAGNOSIS — U071 COVID-19: Secondary | ICD-10-CM | POA: Diagnosis not present

## 2022-01-08 DIAGNOSIS — Z6827 Body mass index (BMI) 27.0-27.9, adult: Secondary | ICD-10-CM | POA: Diagnosis not present

## 2022-01-08 DIAGNOSIS — R051 Acute cough: Secondary | ICD-10-CM | POA: Diagnosis not present

## 2022-05-26 DIAGNOSIS — Z1231 Encounter for screening mammogram for malignant neoplasm of breast: Secondary | ICD-10-CM | POA: Diagnosis not present

## 2022-05-26 DIAGNOSIS — Z01419 Encounter for gynecological examination (general) (routine) without abnormal findings: Secondary | ICD-10-CM | POA: Diagnosis not present

## 2022-06-22 ENCOUNTER — Ambulatory Visit: Payer: BC Managed Care – PPO | Admitting: Medical

## 2022-06-22 VITALS — BP 130/60 | HR 68 | Temp 98.2°F | Resp 18 | Ht 66.0 in | Wt 174.2 lb

## 2022-06-22 DIAGNOSIS — R21 Rash and other nonspecific skin eruption: Secondary | ICD-10-CM | POA: Diagnosis not present

## 2022-06-22 MED ORDER — TRIAMCINOLONE ACETONIDE 0.1 % EX CREA: 1.0000 | TOPICAL_CREAM | Freq: Two times a day (BID) | CUTANEOUS | 0 refills | Status: AC

## 2022-06-22 NOTE — Progress Notes (Signed)
Subjective:    Patient ID: Linda Herring, female    DOB: June 27, 1980, 42 y.o.   MRN: [id number]  HPI  Pt has area behind left ear(new [place]). Pt noticed using glasses at work felt small pump and flaky skin. She did not use any medication to area.   Pt does color her hair. She will get occasional red and inflammed. It did itch. She has been coloring her hair about 3-4 month and about twice a month.   Review of Systems  Constitutional:  Negative for chills, fatigue and fever.  HENT:  Negative for drooling.   Respiratory:  Negative for cough, chest tightness and wheezing.   Cardiovascular:  Negative for chest pain and palpitations.  Gastrointestinal:  Negative for abdominal pain and blood in stool.  Genitourinary:  Negative for dysuria and flank pain.  Musculoskeletal:  Negative for back pain and joint swelling.  Neurological:  Negative for dizziness, numbness and headaches.  Hematological:  Negative for adenopathy. Does not bruise/bleed easily.  Psychiatric/Behavioral:  Negative for behavioral problems and confusion.     Past Medical History:  Diagnosis Date   Anorexia nervosa with bulimia    Environmental and seasonal allergies    History of 2019 novel coronavirus disease (COVID-19)    per pt positive 08/ 2021 w/ moderate symtpoms that resolved in 3 weeks   History of anemia    History of migraine    HSV-1 infection    fever blisters   Miscarriage    Rash    Stress induced     Social History   Socioeconomic History   Marital status: Married    Spouse name: Not on file   Number of children: 0   Years of education: 16   Highest education level: Not on file  Occupational History   Occupation: Agricultural consultant at [employer]  Tobacco Use   Smoking status: Former    Years: 3.00    Types: Cigarettes    Quit date: 09/06/2002    Years since quitting: 19.8   Smokeless tobacco: Never  Vaping Use   Vaping Use: Never used  Substance and Sexual Activity   Alcohol use: Yes     Alcohol/week: 2.0 standard drinks of alcohol    Types: 2 Glasses of wine per week    Comment: socail   Drug use: Never   Sexual activity: Yes    Partners: Male    Birth control/protection: None  Other Topics Concern   Not on file  Social History Narrative   Fun: Read, hike and travel.   Denies religious beliefs effecting health care.    Social Determinants of Health   Financial Resource Strain: Not on file  Food Insecurity: Not on file  Transportation Needs: Not on file  Physical Activity: Not on file  Stress: Not on file  Social Connections: Not on file  Intimate Partner Violence: Not on file    Past Surgical History:  Procedure Laterality Date   DILATION AND CURETTAGE OF UTERUS  2002  approx   for missed ab   NASAL SEPTUM SURGERY  age 89   TUMOR REMOVAL  age 15   per pt benign tumor removal from face    Family History  Problem Relation Age of Onset   Lung cancer Father    Diabetes Father    Cancer Sister        uterine    Migraines Sister    Migraines Maternal Aunt    Heart disease Maternal Grandmother  Heart attack Maternal Grandmother    Colon cancer Paternal Grandmother    Other Paternal Grandfather        brain tumor    No Known Allergies  Current Outpatient Medications on File Prior to Visit  Medication Sig Dispense Refill   Ascorbic Acid (VITAMIN C PO) Take by mouth daily.     BIOTIN PO Take by mouth daily.     Calcium Carbonate-Vitamin D (CALCIUM-D PO) Take by mouth daily.     Cholecalciferol (VITAMIN D3 PO) Take by mouth daily.     Cyanocobalamin (B-12 PO) Take by mouth daily.     gabapentin (NEURONTIN) 100 MG capsule Take 1 capsule (100 mg total) by mouth at bedtime. 30 capsule 0   loratadine (CLARITIN) 10 MG tablet Take 1 tablet (10 mg total) by mouth daily. (Patient taking differently: Take 10 mg by mouth daily as needed.) 30 tablet 11   methylPREDNISolone (MEDROL) 4 MG tablet 4 tab po day 1, 3 tab po day 2, 2 tab po day 3, and 1 tab po day  4. 10 tablet 0   ondansetron (ZOFRAN-ODT) 4 MG disintegrating tablet Take 1-2 tablets (4-8 mg total) by mouth every 8 (eight) hours as needed for nausea. 30 tablet 3   SUMAtriptan (IMITREX) 100 MG tablet Take 100 mg by mouth at onset of migraine. May repeat in 2 hours if headache returns or persists. Do not take more than 2 tablets in 24 hours. 10 tablet 11   valACYclovir (VALTREX) 500 MG tablet 1 tablet BID x 3 days as needed (Patient taking differently: Take 500 mg by mouth as needed. 1 tablet BID x 3 days as needed) 30 tablet 1   valACYclovir (VALTREX) 500 MG tablet Take 1 tablet (500 mg total) by mouth 2 (two) times daily. 20 tablet 0   VITAMIN E PO Take by mouth daily.     No current facility-administered medications on file prior to visit.    BP 130/60   Pulse 68   Temp 98.2 F (36.8 C)   Resp 18   Ht 5' 6"$  (1.676 m)   Wt 174 lb 3.2 oz (79 kg)   SpO2 99%   BMI 28.12 kg/m        Objective:   Physical Exam   General Mental Status- Alert. General Appearance- Not in acute distress.   Skin Behind left ear flaky skin in crease behind ear. No warmth, no yellow dc or tenderness.  Scalp. Appear dry and flaky.   Neck Carotid Arteries- Normal color. Moisture- Normal Moisture. No carotid bruits. No JVD.  Chest and Lung Exam Auscultation: Breath Sounds:-Normal.  Cardiovascular Auscultation:Rythm- Regular. Murmurs & Other Heart Sounds:Auscultation of the heart reveals- No Murmurs.  Abdomen Inspection:-Inspeection Normal. Palpation/Percussion:Note:No mass. Palpation and Percussion of the abdomen reveal- Non Tender, Non Distended + BS, no rebound or guarding.  Neurologic Cranial Nerve exam:- CN III-XII intact(No nystagmus), symmetric smile. Strength:- 5/5 equal and symmetric strength both upper and lower extremities.      Assessment & Plan:   Patient Instructions  Left ear rash mild eczema like appearance. Maybe related to product that you used on hair to color. Also  scalp skin is flaky.   Recommend that you use nizoral shampoo to scalp/hair.  For left ear rash use triamcinolone cream twice daily for one week. Second week just once daily. Stop on 3rd week.  Refer to dermatologist to evaluate and treat. See if they recommend product for hair to color that has least chance  of scalp irritation.  Follow up date to be determined after dermatologist visit.      Mackie Pai, PA-C

## 2022-06-22 NOTE — Patient Instructions (Addendum)
Left ear rash mild eczema like appearance. Maybe related to product that you used on hair to color. Also scalp skin is flaky.   Recommend that you use nizoral shampoo to scalp/hair.  For left ear rash use triamcinolone cream twice daily for one week. Second week just once daily. Stop on 3rd week.  Refer to dermatologist to evaluate and treat. See if they recommend product for hair to color that has least chance of scalp irritation.  Follow up date to be determined after dermatologist visit.

## 2022-10-20 ENCOUNTER — Ambulatory Visit: Payer: BC Managed Care – PPO | Admitting: Podiatry

## 2022-11-24 ENCOUNTER — Encounter: Payer: Self-pay | Admitting: Medical

## 2022-11-24 NOTE — Addendum Note (Signed)
Addended by: Gwenevere Abbot on: 11/24/2022 08:03 PM   Modules accepted: Orders

## 2023-01-12 DIAGNOSIS — L818 Other specified disorders of pigmentation: Secondary | ICD-10-CM | POA: Diagnosis not present

## 2023-01-12 DIAGNOSIS — D225 Melanocytic nevi of trunk: Secondary | ICD-10-CM | POA: Diagnosis not present

## 2023-01-12 DIAGNOSIS — D485 Neoplasm of uncertain behavior of skin: Secondary | ICD-10-CM | POA: Diagnosis not present

## 2023-01-12 DIAGNOSIS — L258 Unspecified contact dermatitis due to other agents: Secondary | ICD-10-CM | POA: Diagnosis not present

## 2023-05-03 DIAGNOSIS — U071 COVID-19: Secondary | ICD-10-CM | POA: Diagnosis not present

## 2023-05-03 DIAGNOSIS — R0981 Nasal congestion: Secondary | ICD-10-CM | POA: Diagnosis not present

## 2023-05-03 DIAGNOSIS — R051 Acute cough: Secondary | ICD-10-CM | POA: Diagnosis not present

## 2023-05-03 DIAGNOSIS — Z789 Other specified health status: Secondary | ICD-10-CM | POA: Diagnosis not present

## 2023-05-03 DIAGNOSIS — Z6827 Body mass index (BMI) 27.0-27.9, adult: Secondary | ICD-10-CM | POA: Diagnosis not present

## 2023-05-03 DIAGNOSIS — R5383 Other fatigue: Secondary | ICD-10-CM | POA: Diagnosis not present

## 2023-05-14 ENCOUNTER — Other Ambulatory Visit: Payer: Self-pay | Admitting: Obstetrics and Gynecology

## 2023-05-14 DIAGNOSIS — Z1231 Encounter for screening mammogram for malignant neoplasm of breast: Secondary | ICD-10-CM

## 2023-05-26 DIAGNOSIS — D2261 Melanocytic nevi of right upper limb, including shoulder: Secondary | ICD-10-CM | POA: Diagnosis not present

## 2023-05-26 DIAGNOSIS — L905 Scar conditions and fibrosis of skin: Secondary | ICD-10-CM | POA: Diagnosis not present

## 2023-05-26 DIAGNOSIS — D485 Neoplasm of uncertain behavior of skin: Secondary | ICD-10-CM | POA: Diagnosis not present

## 2023-05-26 DIAGNOSIS — D225 Melanocytic nevi of trunk: Secondary | ICD-10-CM | POA: Diagnosis not present

## 2023-06-07 ENCOUNTER — Ambulatory Visit
Admission: RE | Admit: 2023-06-07 | Discharge: 2023-06-07 | Disposition: A | Discharge status: Home / Self Care | Payer: BC Managed Care – PPO | Source: Ambulatory Visit | Attending: Obstetrics and Gynecology | Admitting: Obstetrics and Gynecology

## 2023-06-07 DIAGNOSIS — Z1231 Encounter for screening mammogram for malignant neoplasm of breast: Secondary | ICD-10-CM | POA: Diagnosis not present

## 2023-06-16 DIAGNOSIS — Z1330 Encounter for screening examination for mental health and behavioral disorders, unspecified: Secondary | ICD-10-CM | POA: Diagnosis not present

## 2023-06-16 DIAGNOSIS — Z01419 Encounter for gynecological examination (general) (routine) without abnormal findings: Secondary | ICD-10-CM | POA: Diagnosis not present

## 2023-11-15 ENCOUNTER — Encounter: Payer: Self-pay | Admitting: Medical

## 2023-11-15 ENCOUNTER — Ambulatory Visit: Admitting: Medical

## 2023-11-15 VITALS — BP 122/70 | HR 71 | Temp 97.7°F | Resp 17 | Ht 66.0 in | Wt 179.8 lb

## 2023-11-15 DIAGNOSIS — G43109 Migraine with aura, not intractable, without status migrainosus: Secondary | ICD-10-CM | POA: Diagnosis not present

## 2023-11-15 DIAGNOSIS — E663 Overweight: Secondary | ICD-10-CM | POA: Diagnosis not present

## 2023-11-15 DIAGNOSIS — R739 Hyperglycemia, unspecified: Secondary | ICD-10-CM | POA: Diagnosis not present

## 2023-11-15 DIAGNOSIS — R5383 Other fatigue: Secondary | ICD-10-CM | POA: Diagnosis not present

## 2023-11-15 DIAGNOSIS — Z0000 Encounter for general adult medical examination without abnormal findings: Secondary | ICD-10-CM

## 2023-11-15 LAB — COMPREHENSIVE METABOLIC PANEL WITH GFR
ALT: 54 U/L — ABNORMAL HIGH (ref 0–35)
AST: 35 U/L (ref 0–37)
Albumin: 4.7 g/dL (ref 3.5–5.2)
Alkaline Phosphatase: 59 U/L (ref 39–117)
BUN: 16 mg/dL (ref 6–23)
CO2: 28 meq/L (ref 19–32)
Calcium: 9.3 mg/dL (ref 8.4–10.5)
Chloride: 104 meq/L (ref 96–112)
Creatinine, Ser: 0.78 mg/dL (ref 0.40–1.20)
GFR: 92.3 mL/min (ref >60.00)
Glucose, Bld: 95 mg/dL (ref 70–99)
Potassium: 4.8 meq/L (ref 3.5–5.1)
Sodium: 138 meq/L (ref 135–145)
Total Bilirubin: 0.5 mg/dL (ref 0.2–1.2)
Total Protein: 7.2 g/dL (ref 6.0–8.3)

## 2023-11-15 LAB — LIPID PANEL
Cholesterol: 267 mg/dL — ABNORMAL HIGH (ref 0–200)
HDL: 74.5 mg/dL (ref >39.00)
LDL Cholesterol: 166 mg/dL — ABNORMAL HIGH (ref 0–99)
NonHDL: 192.56
Total CHOL/HDL Ratio: 4
Triglycerides: 134 mg/dL (ref 0.0–149.0)
VLDL: 26.8 mg/dL (ref 0.0–40.0)

## 2023-11-15 LAB — CBC WITH DIFFERENTIAL/PLATELET
Basophils Absolute: 0 K/uL (ref 0.0–0.1)
Basophils Relative: 0.7 % (ref 0.0–3.0)
Eosinophils Absolute: 0.1 K/uL (ref 0.0–0.7)
Eosinophils Relative: 1.3 % (ref 0.0–5.0)
HCT: 39.3 % (ref 36.0–46.0)
Hemoglobin: 13.2 g/dL (ref 12.0–15.0)
Lymphocytes Relative: 22 % (ref 12.0–46.0)
Lymphs Abs: 1.2 K/uL (ref 0.7–4.0)
MCHC: 33.6 g/dL (ref 30.0–36.0)
MCV: 94.8 fl (ref 78.0–100.0)
Monocytes Absolute: 0.4 K/uL (ref 0.1–1.0)
Monocytes Relative: 8.1 % (ref 3.0–12.0)
Neutro Abs: 3.6 K/uL (ref 1.4–7.7)
Neutrophils Relative %: 67.9 % (ref 43.0–77.0)
Platelets: 385 K/uL (ref 150.0–400.0)
RBC: 4.15 Mil/uL (ref 3.87–5.11)
RDW: 13 % (ref 11.5–15.5)
WBC: 5.3 K/uL (ref 4.0–10.5)

## 2023-11-15 LAB — T4, FREE: Free T4: 0.67 ng/dL (ref 0.60–1.60)

## 2023-11-15 LAB — VITAMIN B12: Vitamin B-12: 731 pg/mL (ref 211–911)

## 2023-11-15 LAB — HEMOGLOBIN A1C: Hgb A1c MFr Bld: 5.7 % (ref 4.6–6.5)

## 2023-11-15 LAB — TSH: TSH: 1.44 u[IU]/mL (ref 0.35–5.50)

## 2023-11-15 NOTE — Patient Instructions (Addendum)
 For you wellness exam today I have ordered cbc, cmp and lipid panel.  Vaccine given today.   Recommend exercise and healthy diet.  We will let you know lab results as they come in.  Follow up date appointment will be determined after lab review.    Fatigue - Order TSH and T4. - Order B12 and B1 levels.  Weight Gain with elevated sugars Significant weight gain despite healthy lifestyle. Consider metabolic or endocrine causes, including thyroid  dysfunction and diabetes. Discussed metformin and GLP-1 receptor agonists for weight loss. - Order A1c. - Evaluate thyroid  function with TSH and T4. - Discuss metformin for weight loss if indicated. - Consider GLP-1 receptor agonists if BMI >30 or diabetic.  Migraine Headaches Recurrent migraines, previously resolved, now mild and responsive to Advil. Discussed non-response to triptans/made ha worse in past and alternative treatments if needed. - Advise Advil liquid gel for mild headaches. - Consider Toradol  injection for moderate to severe headaches. - Consult neurologist Dr. Ines if headaches worsen.  General Health Maintenance No physical exam since 2019. - Perform wellness exam. - Order basic wellness labs.   Preventive Care 45-4 Years Old, Female Preventive care refers to lifestyle choices and visits with your health care provider that can promote health and wellness. Preventive care visits are also called wellness exams. What can I expect for my preventive care visit? Counseling Your health care provider may ask you questions about your: Medical history, including: Past medical problems. Family medical history. Pregnancy history. Current health, including: Menstrual cycle. Method of birth control. Emotional well-being. Home life and relationship well-being. Sexual activity and sexual health. Lifestyle, including: Alcohol, nicotine or tobacco, and drug use. Access to firearms. Diet, exercise, and sleep habits. Work and  work Astronomer. Sunscreen use. Safety issues such as seatbelt and bike helmet use. Physical exam Your health care provider will check your: Height and weight. These may be used to calculate your BMI (body mass index). BMI is a measurement that tells if you are at a healthy weight. Waist circumference. This measures the distance around your waistline. This measurement also tells if you are at a healthy weight and may help predict your risk of certain diseases, such as type 2 diabetes and high blood pressure. Heart rate and blood pressure. Body temperature. Skin for abnormal spots. What immunizations do I need?  Vaccines are usually given at various ages, according to a schedule. Your health care provider will recommend vaccines for you based on your age, medical history, and lifestyle or other factors, such as travel or where you work. What tests do I need? Screening Your health care provider may recommend screening tests for certain conditions. This may include: Lipid and cholesterol levels. Diabetes screening. This is done by checking your blood sugar (glucose) after you have not eaten for a while (fasting). Pelvic exam and Pap test. Hepatitis B test. Hepatitis C test. HIV (human immunodeficiency virus) test. STI (sexually transmitted infection) testing, if you are at risk. Lung cancer screening. Colorectal cancer screening. Mammogram. Talk with your health care provider about when you should start having regular mammograms. This may depend on whether you have a family history of breast cancer. BRCA-related cancer screening. This may be done if you have a family history of breast, ovarian, tubal, or peritoneal cancers. Bone density scan. This is done to screen for osteoporosis. Talk with your health care provider about your test results, treatment options, and if necessary, the need for more tests. Follow these instructions at home: Eating  and drinking  Eat a diet that includes  fresh fruits and vegetables, whole grains, lean protein, and low-fat dairy products. Take vitamin and mineral supplements as recommended by your health care provider. Do not drink alcohol if: Your health care provider tells you not to drink. You are pregnant, may be pregnant, or are planning to become pregnant. If you drink alcohol: Limit how much you have to 0-1 drink a day. Know how much alcohol is in your drink. In the [place] one drink equals one 12 oz bottle of beer (355 mL), one 5 oz glass of wine (148 mL), or one 1 oz glass of hard liquor (44 mL). Lifestyle Brush your teeth every morning and night with fluoride toothpaste. Floss one time each day. Exercise for at least 30 minutes 5 or more days each week. Do not use any products that contain nicotine or tobacco. These products include cigarettes, chewing tobacco, and vaping devices, such as e-cigarettes. If you need help quitting, ask your health care provider. Do not use drugs. If you are sexually active, practice safe sex. Use a condom or other form of protection to prevent STIs. If you do not wish to become pregnant, use a form of birth control. If you plan to become pregnant, see your health care provider for a prepregnancy visit. Take aspirin only as told by your health care provider. Make sure that you understand how much to take and what form to take. Work with your health care provider to find out whether it is safe and beneficial for you to take aspirin daily. Find healthy ways to manage stress, such as: Meditation, yoga, or listening to music. Journaling. Talking to a trusted person. Spending time with friends and family. Minimize exposure to UV radiation to reduce your risk of skin cancer. Safety Always wear your seat belt while driving or riding in a vehicle. Do not drive: If you have been drinking alcohol. Do not ride with someone who has been drinking. When you are tired or distracted. While texting. If you have been  using any mind-altering substances or drugs. Wear a helmet and other protective equipment during sports activities. If you have firearms in your house, make sure you follow all gun safety procedures. Seek help if you have been physically or sexually abused. What's next? Visit your health care provider once a year for an annual wellness visit. Ask your health care provider how often you should have your eyes and teeth checked. Stay up to date on all vaccines. This information is not intended to replace advice given to you by your health care provider. Make sure you discuss any questions you have with your health care provider. Document Revised: 10/16/2020 Document Reviewed: 10/16/2020 Elsevier Patient Education  2024 [employer].

## 2023-11-15 NOTE — Progress Notes (Signed)
 Subjective:    Patient ID: Linda Herring, female    DOB: Oct 09, 1980, 43 y.o.   MRN: [id number]  HPI Pt in for wellness exam. She is not fasting  Pt works [employer], She exercises 3-4 times a week. Pt trying to cut back on alcohol. Non smoker but smoker only for 4 years.    Lmp- November 07, 2023.   In past had infertility.  Tx failed. Wait gain. Also fatigue.   Hx of migraine ha in the past. Dr. Ines tx her in the past.      Linda Herring is a 43 year old female who presents for a wellness exam and evaluation of hormonal imbalance and weight gain.  She has experienced significant weight gain over the past two years, increasing from a baseline of 137-142 pounds to 179 pounds, despite maintaining a healthy diet and regular exercise. This weight gain began after she stopped taking birth control pills, which she had used in conjunction with fertility treatments.  She reports fatigue, mood swings, hot flashes, and temperature sensitivity. She feels 'really bad' and 'tired' with a frequent desire to sleep. Despite these symptoms, her menstrual cycles remain regular, occurring monthly.  She has a history of migraines, which had resolved for two years after discontinuing medications. However, she experienced a mild recurrence of headaches about three to four weeks ago and again this past weekend. Previously, she was prescribed Imitrex and Zofran  by a neurologist but discontinued them due to adverse effects, including worsened headaches and nausea. Recently, she found relief with Advil liquid gel.  She takes a daily B12 supplement of 1000 mg, as previously recommended due to fatigue, and has not had a B12 injection due to a fear of needles.  Her family history includes diabetes, colon cancer, and heart attacks. She was a smoker from ages 11 to 50 but quit smoking 20 years ago. She consumes alcohol primarily on weekends and does not currently smoke. She works at [employer] and exercises three to  four times a week.   Review of Systems  Constitutional:  Positive for fatigue. Negative for chills and fever.  Respiratory:  Negative for cough, chest tightness and wheezing.   Cardiovascular:  Negative for chest pain and palpitations.  Gastrointestinal:  Negative for abdominal pain.  Endocrine: Positive for heat intolerance.  Genitourinary:  Negative for dyspareunia, dysuria, urgency and vaginal pain.  Musculoskeletal:  Negative for back pain and joint swelling.  Neurological:  Negative for facial asymmetry.  Hematological:  Negative for adenopathy. Does not bruise/bleed easily.  Psychiatric/Behavioral:  Negative for behavioral problems and decreased concentration.     Past Medical History:  Diagnosis Date   Anorexia nervosa with bulimia    Environmental and seasonal allergies    History of 2019 novel coronavirus disease (COVID-19)    per pt positive 08/ 2021 w/ moderate symtpoms that resolved in 3 weeks   History of anemia    History of migraine    HSV-1 infection    fever blisters   Miscarriage    Rash    Stress induced     Social History   Socioeconomic History   Marital status: Married    Spouse name: Not on file   Number of children: 0   Years of education: 16   Highest education level: Not on file  Occupational History   Occupation: Theatre manager at [employer]  Tobacco Use   Smoking status: Former    Current packs/day: 0.00    Types: Cigarettes  Start date: 09/06/1999    Quit date: 09/06/2002    Years since quitting: 21.2   Smokeless tobacco: Never  Vaping Use   Vaping status: Never Used  Substance and Sexual Activity   Alcohol use: Yes    Alcohol/week: 2.0 standard drinks of alcohol    Types: 2 Glasses of wine per week    Comment: socail   Drug use: Never   Sexual activity: Yes    Partners: Male    Birth control/protection: None  Other Topics Concern   Not on file  Social History Narrative   Fun: Read, hike and travel.   Denies religious beliefs  effecting health care.    Social Drivers of Corporate investment banker Strain: Not on file  Food Insecurity: Not on file  Transportation Needs: Not on file  Physical Activity: Not on file  Stress: Not on file  Social Connections: Not on file  Intimate Partner Violence: Not on file    Past Surgical History:  Procedure Laterality Date   DILATION AND CURETTAGE OF UTERUS  2002  approx   for missed ab   NASAL SEPTUM SURGERY  age 21   TUMOR REMOVAL  age 26   per pt benign tumor removal from face    Family History  Problem Relation Age of Onset   Lung cancer Father    Diabetes Father    Cancer Sister        uterine    Migraines Sister    Migraines Maternal Aunt    Heart disease Maternal Grandmother    Heart attack Maternal Grandmother    Colon cancer Paternal Grandmother    Other Paternal Grandfather        brain tumor    No Known Allergies  Current Outpatient Medications on File Prior to Visit  Medication Sig Dispense Refill   BIOTIN PO Take by mouth daily.     Calcium Carbonate-Vitamin D  (CALCIUM-D PO) Take by mouth daily.     loratadine  (CLARITIN ) 10 MG tablet Take 1 tablet (10 mg total) by mouth daily. (Patient taking differently: Take 10 mg by mouth daily as needed.) 30 tablet 11   methylPREDNISolone  (MEDROL ) 4 MG tablet 4 tab po day 1, 3 tab po day 2, 2 tab po day 3, and 1 tab po day 4. 10 tablet 0   triamcinolone  cream (KENALOG ) 0.1 % Apply 1 Application topically 2 (two) times daily. 30 g 0   valACYclovir  (VALTREX ) 500 MG tablet 1 tablet BID x 3 days as needed (Patient taking differently: Take 500 mg by mouth as needed. 1 tablet BID x 3 days as needed) 30 tablet 1   valACYclovir  (VALTREX ) 500 MG tablet Take 1 tablet (500 mg total) by mouth 2 (two) times daily. 20 tablet 0   VITAMIN E PO Take by mouth daily.     Ascorbic Acid (VITAMIN C PO) Take by mouth daily.     Cholecalciferol (VITAMIN D3 PO) Take by mouth daily.     Cyanocobalamin  (B-12 PO) Take by mouth  daily.     gabapentin  (NEURONTIN ) 100 MG capsule Take 1 capsule (100 mg total) by mouth at bedtime. 30 capsule 0   ondansetron  (ZOFRAN -ODT) 4 MG disintegrating tablet Take 1-2 tablets (4-8 mg total) by mouth every 8 (eight) hours as needed for nausea. 30 tablet 3   SUMAtriptan (IMITREX) 100 MG tablet Take 100 mg by mouth at onset of migraine. May repeat in 2 hours if headache returns or persists. Do not take  more than 2 tablets in 24 hours. 10 tablet 11   No current facility-administered medications on file prior to visit.    BP 122/70   Pulse 71   Temp 97.7 F (36.5 C) (Oral)   Resp 17   Ht 5' 6 (1.676 m)   Wt 179 lb 12.8 oz (81.6 kg)   LMP 11/07/2023 (Exact Date)   SpO2 95%   BMI 29.02 kg/m        Objective:   Physical Exam  General Mental Status- Alert. General Appearance- Not in acute distress.   Skin General: Color- Normal Color. Moisture- Normal Moisture.  Neck  No JVD.  Chest and Lung Exam Auscultation: Breath Sounds:-Normal.  Cardiovascular Auscultation:Rythm- Regular. Murmurs & Other Heart Sounds:Auscultation of the heart reveals- No Murmurs.  Abdomen Inspection:-Inspeection Normal. Palpation/Percussion:Note:No mass. Palpation and Percussion of the abdomen reveal- Non Tender, Non Distended + BS, no rebound or guarding.   Neurologic Cranial Nerve exam:- CN III-XII intact(No nystagmus), symmetric smile. Strength:- 5/5 equal and symmetric strength both upper and lower extremities.       Assessment & Plan:   Patient Instructions  For you wellness exam today I have ordered cbc, cmp and lipid panel.  Vaccine given today.   Recommend exercise and healthy diet.  We will let you know lab results as they come in.  Follow up date appointment will be determined after lab review.    Fatigue - Order TSH and T4. - Order B12 and B1 levels.  Weight Gain with elevated sugars Significant weight gain despite healthy lifestyle. Consider metabolic or  endocrine causes, including thyroid  dysfunction and diabetes. Discussed metformin and GLP-1 receptor agonists for weight loss. - Order A1c. - Evaluate thyroid  function with TSH and T4. - Discuss metformin for weight loss if indicated. - Consider GLP-1 receptor agonists if BMI >30 or diabetic.  Migraine Headaches Recurrent migraines, previously resolved, now mild and responsive to Advil. Discussed non-response to triptans/made ha worse in past and alternative treatments if needed. - Advise Advil liquid gel for mild headaches. - Consider Toradol  injection for moderate to severe headaches. - Consult neurologist Dr. Ines if headaches worsen.  General Health Maintenance No physical exam since 2019. - Perform wellness exam. - Order basic wellness labs.   Dallas Maxwell, PA-C   [id number] charge as did address, /plan discussed for ha, overweight and fatigue.

## 2023-11-17 ENCOUNTER — Encounter: Payer: Self-pay | Admitting: Medical

## 2023-11-18 ENCOUNTER — Ambulatory Visit: Payer: Self-pay | Admitting: Medical

## 2023-11-19 MED ORDER — METFORMIN HCL 500 MG PO TABS: 500.0000 mg | ORAL_TABLET | Freq: Two times a day (BID) | ORAL | 3 refills | Status: AC

## 2023-11-19 NOTE — Addendum Note (Signed)
 Addended by: DORINA DALLAS HERO on: 11/19/2023 07:51 PM   Modules accepted: Orders

## 2023-11-20 LAB — VITAMIN B1: Vitamin B1 (Thiamine): 9 nmol/L (ref 8–30)

## 2024-02-15 ENCOUNTER — Ambulatory Visit: Admitting: Medical

## 2024-03-20 ENCOUNTER — Ambulatory Visit (INDEPENDENT_AMBULATORY_CARE_PROVIDER_SITE_OTHER)

## 2024-03-20 ENCOUNTER — Ambulatory Visit: Admitting: Podiatry

## 2024-03-20 VITALS — Ht 66.0 in | Wt 179.8 lb

## 2024-03-20 DIAGNOSIS — M21619 Bunion of unspecified foot: Secondary | ICD-10-CM

## 2024-03-20 DIAGNOSIS — M21611 Bunion of right foot: Secondary | ICD-10-CM | POA: Diagnosis not present

## 2024-03-20 DIAGNOSIS — M7752 Other enthesopathy of left foot: Secondary | ICD-10-CM

## 2024-03-20 MED ORDER — MELOXICAM 15 MG PO TABS: 15.0000 mg | ORAL_TABLET | Freq: Every day | ORAL | 0 refills | Status: AC | PRN

## 2024-03-21 NOTE — Progress Notes (Signed)
  Subjective:  Patient ID: Linda Herring, female    DOB: 05-06-1980,  MRN: [id number]  Chief Complaint  Patient presents with   Bunions    RM 12 Patient is here for left foot bunion pain.    Discussed the use of AI scribe software for clinical note transcription with the patient, who gave verbal consent to proceed.  History of Present Illness Linda Herring is a 43 year old female who presents with a bunion on her left foot.  Approximately one week ago, she noticed a bump on her left foot. The area is painful when wearing narrow shoes. There is no history of recent injury or fall. She has not received any treatment or taken medications for the pain. The pain is localized to the bunion area and does not radiate. The right foot is unaffected.  Her social history includes frequent wearing of high heels in the past, which she associates with the development of bunions. She recalls wearing heels extensively when she was younger.      Objective:  There were no vitals filed for this visit.  Physical Exam General: AAO x3, NAD  Dermatological: Skin is warm, dry and supple bilateral. There are no open sores, no preulcerative lesions, no rash or signs of infection present.  Vascular: Dorsalis Pedis artery and Posterior Tibial artery pedal pulses are 2/4 bilateral with immedate capillary fill time. There is no pain with calf compression, swelling, warmth, erythema.   Neruologic: Grossly intact via light touch bilateral.   Musculoskeletal: On the left foot there is a bunion present.  There is a small palpable mobile fluid-filled soft tissue mass consistent with likely bursa along the area of the bunion.  She said that this area was much larger originally she took shoes off but it has subsided.  Tenderness to palpation directly on this area.  No other areas of discomfort.     No images are attached to the encounter.    Results RADIOLOGY Foot X-ray: Hallux valgus with medial deviation of the  first metatarsal, foot slightly rotated, increased joint mobility, presence of bursa (03/20/2024)   Assessment:   1. Bunion   2. Bursitis of left foot      Plan:  Patient was evaluated and treated and all questions answered.  Assessment and Plan Assessment & Plan Left foot bunion with associated bursitis Acute bunion with lateral deviation of the first metatarsal and bursitis due to inflammation from shoe friction. She prefers to avoid steroid injections initially. - Prescribed anti-inflammatory medication to take as needed.  Prescribe meloxicam. - Provided bunion pads to reduce pressure. - Discussed aspiration and likely steroid injection if conservative measures fail. - Advised to monitor condition and report if no improvement.   Return if symptoms worsen or fail to improve.   Donnice JONELLE Fees DPM

## 2024-04-14 ENCOUNTER — Other Ambulatory Visit: Payer: Self-pay | Admitting: Obstetrics and Gynecology

## 2024-04-14 DIAGNOSIS — Z1231 Encounter for screening mammogram for malignant neoplasm of breast: Secondary | ICD-10-CM

## 2024-05-13 ENCOUNTER — Encounter: Payer: Self-pay | Admitting: Medical

## 2024-05-15 ENCOUNTER — Other Ambulatory Visit: Payer: Self-pay

## 2024-05-15 MED ORDER — METFORMIN HCL 500 MG PO TABS: 500.0000 mg | ORAL_TABLET | Freq: Two times a day (BID) | ORAL | 3 refills | Status: AC

## 2024-05-15 NOTE — Telephone Encounter (Signed)
 Called pt and she says that she does not feel the need to fu with pcp that she has already lost 6 pounds and she would rather go through her OBGYN? I notified her that if she choses not to fu that her meds might have to be put on hold until she does but she still declined

## 2024-05-15 NOTE — Addendum Note (Signed)
 Addended by: DORINA DALLAS DORINA PA-C M on: 05/15/2024 10:18 AM   Modules accepted: Orders

## 2024-06-07 ENCOUNTER — Ambulatory Visit

## 2024-07-03 ENCOUNTER — Ambulatory Visit
